# Patient Record
Sex: Female | Born: 1950 | Race: Black or African American | Hispanic: No | Marital: Single | Smoking: Never smoker
Health system: Southern US, Community
[De-identification: ages and names within clinical notes are randomized; demographics above are authoritative.]

## PROBLEM LIST (undated history)

## (undated) DIAGNOSIS — E7800 Pure hypercholesterolemia, unspecified: Secondary | ICD-10-CM

## (undated) DIAGNOSIS — M549 Dorsalgia, unspecified: Secondary | ICD-10-CM

## (undated) DIAGNOSIS — G8929 Other chronic pain: Secondary | ICD-10-CM

## (undated) DIAGNOSIS — I10 Essential (primary) hypertension: Secondary | ICD-10-CM

## (undated) HISTORY — DX: Pure hypercholesterolemia, unspecified: E78.00

## (undated) HISTORY — PX: APPENDECTOMY: SHX54

---

## 2008-03-29 ENCOUNTER — Emergency Department (HOSPITAL_COMMUNITY): Admission: EM | Admit: 2008-03-29 | Discharge: 2008-03-29 | Payer: Self-pay | Admitting: Emergency Medicine

## 2008-07-07 ENCOUNTER — Encounter: Admission: RE | Admit: 2008-07-07 | Discharge: 2008-07-07 | Payer: Self-pay | Admitting: Orthopedic Surgery

## 2008-10-15 ENCOUNTER — Encounter: Admission: RE | Admit: 2008-10-15 | Discharge: 2008-10-15 | Payer: Self-pay | Admitting: Orthopedic Surgery

## 2008-12-25 ENCOUNTER — Ambulatory Visit (HOSPITAL_COMMUNITY): Admission: RE | Admit: 2008-12-25 | Discharge: 2008-12-26 | Payer: Self-pay | Admitting: Orthopedic Surgery

## 2009-02-16 ENCOUNTER — Encounter: Admission: RE | Admit: 2009-02-16 | Discharge: 2009-05-17 | Payer: Self-pay | Admitting: Orthopedic Surgery

## 2009-05-25 ENCOUNTER — Encounter: Admission: RE | Admit: 2009-05-25 | Discharge: 2009-07-08 | Payer: Self-pay | Admitting: Orthopedic Surgery

## 2009-08-27 ENCOUNTER — Emergency Department (HOSPITAL_COMMUNITY): Admission: EM | Admit: 2009-08-27 | Discharge: 2009-08-27 | Payer: Self-pay | Admitting: Family Medicine

## 2009-10-12 ENCOUNTER — Encounter: Payer: Self-pay | Admitting: Internal Medicine

## 2009-10-12 ENCOUNTER — Ambulatory Visit: Payer: Self-pay | Admitting: Internal Medicine

## 2009-10-12 ENCOUNTER — Encounter (INDEPENDENT_AMBULATORY_CARE_PROVIDER_SITE_OTHER): Payer: Self-pay | Admitting: Family Medicine

## 2009-10-12 LAB — CONVERTED CEMR LAB
ALT: 12 units/L (ref 0–35)
AST: 15 units/L (ref 0–37)
Albumin: 4.3 g/dL (ref 3.5–5.2)
Alkaline Phosphatase: 55 units/L (ref 39–117)
BUN: 17 mg/dL (ref 6–23)
Basophils Absolute: 0 10*3/uL (ref 0.0–0.1)
Basophils Relative: 1 % (ref 0–1)
CO2: 30 meq/L (ref 19–32)
Calcium: 9.6 mg/dL (ref 8.4–10.5)
Chloride: 106 meq/L (ref 96–112)
Cholesterol: 196 mg/dL (ref 0–200)
Creatinine, Ser: 0.88 mg/dL (ref 0.40–1.20)
Eosinophils Absolute: 0.1 10*3/uL (ref 0.0–0.7)
Eosinophils Relative: 1 % (ref 0–5)
Glucose, Bld: 100 mg/dL — ABNORMAL HIGH (ref 70–99)
HCT: 43.5 % (ref 36.0–46.0)
HDL: 58 mg/dL (ref >39)
Hemoglobin: 13.9 g/dL (ref 12.0–15.0)
LDL Cholesterol: 122 mg/dL — ABNORMAL HIGH (ref 0–99)
Lymphocytes Relative: 27 % (ref 12–46)
Lymphs Abs: 1.6 10*3/uL (ref 0.7–4.0)
MCHC: 32 g/dL (ref 30.0–36.0)
MCV: 89.7 fL (ref 78.0–100.0)
Monocytes Absolute: 0.2 10*3/uL (ref 0.1–1.0)
Monocytes Relative: 4 % (ref 3–12)
Neutro Abs: 4 10*3/uL (ref 1.7–7.7)
Neutrophils Relative %: 68 % (ref 43–77)
Platelets: 223 10*3/uL (ref 150–400)
Potassium: 4.2 meq/L (ref 3.5–5.3)
RBC: 4.85 M/uL (ref 3.87–5.11)
RDW: 15.1 % (ref 11.5–15.5)
Sodium: 144 meq/L (ref 135–145)
TSH: 1.984 microintl units/mL (ref 0.350–4.500)
Total Bilirubin: 0.7 mg/dL (ref 0.3–1.2)
Total CHOL/HDL Ratio: 3.4
Total Protein: 6.6 g/dL (ref 6.0–8.3)
Triglycerides: 80 mg/dL (ref <150)
VLDL: 16 mg/dL (ref 0–40)
WBC: 5.9 10*3/uL (ref 4.0–10.5)

## 2009-10-13 ENCOUNTER — Encounter: Payer: Self-pay | Admitting: Internal Medicine

## 2009-10-20 ENCOUNTER — Ambulatory Visit: Payer: Self-pay | Admitting: Internal Medicine

## 2009-10-20 ENCOUNTER — Ambulatory Visit (HOSPITAL_COMMUNITY): Admission: RE | Admit: 2009-10-20 | Discharge: 2009-10-20 | Payer: Self-pay | Admitting: Internal Medicine

## 2009-10-25 ENCOUNTER — Emergency Department (HOSPITAL_COMMUNITY): Admission: EM | Admit: 2009-10-25 | Discharge: 2009-10-26 | Payer: Self-pay | Admitting: Emergency Medicine

## 2009-10-26 ENCOUNTER — Encounter: Payer: Self-pay | Admitting: Internal Medicine

## 2009-11-01 ENCOUNTER — Ambulatory Visit: Payer: Self-pay | Admitting: Internal Medicine

## 2009-11-03 DIAGNOSIS — I10 Essential (primary) hypertension: Secondary | ICD-10-CM | POA: Insufficient documentation

## 2009-11-03 DIAGNOSIS — E7800 Pure hypercholesterolemia, unspecified: Secondary | ICD-10-CM

## 2009-11-04 ENCOUNTER — Ambulatory Visit: Payer: Self-pay | Admitting: Internal Medicine

## 2009-11-04 DIAGNOSIS — E663 Overweight: Secondary | ICD-10-CM | POA: Insufficient documentation

## 2009-11-04 DIAGNOSIS — R079 Chest pain, unspecified: Secondary | ICD-10-CM

## 2009-11-26 ENCOUNTER — Encounter (INDEPENDENT_AMBULATORY_CARE_PROVIDER_SITE_OTHER): Payer: Self-pay | Admitting: *Deleted

## 2009-11-26 ENCOUNTER — Ambulatory Visit: Payer: Self-pay

## 2009-11-26 ENCOUNTER — Ambulatory Visit: Payer: Self-pay | Admitting: Internal Medicine

## 2009-11-26 ENCOUNTER — Ambulatory Visit (HOSPITAL_COMMUNITY): Admission: RE | Admit: 2009-11-26 | Discharge: 2009-11-26 | Payer: Self-pay | Admitting: Internal Medicine

## 2009-11-26 ENCOUNTER — Encounter: Payer: Self-pay | Admitting: Internal Medicine

## 2009-12-01 ENCOUNTER — Telehealth: Payer: Self-pay | Admitting: Internal Medicine

## 2009-12-03 ENCOUNTER — Encounter (INDEPENDENT_AMBULATORY_CARE_PROVIDER_SITE_OTHER): Payer: Self-pay | Admitting: *Deleted

## 2009-12-07 ENCOUNTER — Ambulatory Visit: Payer: Self-pay | Admitting: Internal Medicine

## 2009-12-13 ENCOUNTER — Telehealth: Payer: Self-pay | Admitting: Internal Medicine

## 2009-12-17 ENCOUNTER — Ambulatory Visit: Payer: Self-pay | Admitting: Internal Medicine

## 2009-12-17 DIAGNOSIS — I10 Essential (primary) hypertension: Secondary | ICD-10-CM

## 2009-12-17 LAB — CONVERTED CEMR LAB
ALT: 18 units/L (ref 0–35)
AST: 24 units/L (ref 0–37)
Cholesterol: 134 mg/dL (ref 0–200)
LDL Cholesterol: 72 mg/dL (ref 0–99)
Total Bilirubin: 1 mg/dL (ref 0.3–1.2)
Total CHOL/HDL Ratio: 2
Total Protein: 6.6 g/dL (ref 6.0–8.3)

## 2010-02-09 ENCOUNTER — Ambulatory Visit: Payer: Self-pay | Admitting: Internal Medicine

## 2010-02-10 ENCOUNTER — Ambulatory Visit (HOSPITAL_COMMUNITY): Admission: RE | Admit: 2010-02-10 | Discharge: 2010-02-10 | Payer: Self-pay | Admitting: Family Medicine

## 2010-02-28 ENCOUNTER — Encounter
Admission: RE | Admit: 2010-02-28 | Discharge: 2010-05-05 | Payer: Self-pay | Source: Home / Self Care | Admitting: Family Medicine

## 2010-03-21 ENCOUNTER — Telehealth: Payer: Self-pay | Admitting: Internal Medicine

## 2010-05-04 ENCOUNTER — Ambulatory Visit: Payer: Self-pay | Admitting: Internal Medicine

## 2010-05-04 ENCOUNTER — Encounter (INDEPENDENT_AMBULATORY_CARE_PROVIDER_SITE_OTHER): Payer: Self-pay | Admitting: Family Medicine

## 2010-05-04 LAB — CONVERTED CEMR LAB
BUN: 15 mg/dL (ref 6–23)
CO2: 32 meq/L (ref 19–32)
Chloride: 104 meq/L (ref 96–112)
HDL: 53 mg/dL (ref >39)
LDL Cholesterol: 87 mg/dL (ref 0–99)
Potassium: 4.6 meq/L (ref 3.5–5.3)
Triglycerides: 80 mg/dL (ref <150)
VLDL: 16 mg/dL (ref 0–40)

## 2010-07-04 ENCOUNTER — Ambulatory Visit (HOSPITAL_COMMUNITY): Admission: RE | Admit: 2010-07-04 | Discharge: 2010-07-04 | Payer: Self-pay | Admitting: Family Medicine

## 2010-07-24 ENCOUNTER — Emergency Department (HOSPITAL_COMMUNITY)
Admission: EM | Admit: 2010-07-24 | Discharge: 2010-07-24 | Payer: Self-pay | Source: Home / Self Care | Admitting: Emergency Medicine

## 2010-08-11 ENCOUNTER — Encounter
Admission: RE | Admit: 2010-08-11 | Discharge: 2010-08-11 | Payer: Self-pay | Source: Home / Self Care | Attending: Orthopedic Surgery | Admitting: Orthopedic Surgery

## 2010-08-13 ENCOUNTER — Encounter
Admission: RE | Admit: 2010-08-13 | Discharge: 2010-08-13 | Payer: Self-pay | Source: Home / Self Care | Attending: Orthopedic Surgery | Admitting: Orthopedic Surgery

## 2010-08-29 ENCOUNTER — Telehealth: Payer: Self-pay | Admitting: Internal Medicine

## 2010-09-13 ENCOUNTER — Other Ambulatory Visit (HOSPITAL_COMMUNITY)
Admission: RE | Admit: 2010-09-13 | Discharge: 2010-09-13 | Disposition: A | Discharge status: Home / Self Care | Payer: Medicaid Other | Source: Ambulatory Visit | Attending: Obstetrics and Gynecology | Admitting: Obstetrics and Gynecology

## 2010-09-13 ENCOUNTER — Other Ambulatory Visit: Payer: Self-pay | Admitting: Obstetrics and Gynecology

## 2010-09-13 DIAGNOSIS — Z01419 Encounter for gynecological examination (general) (routine) without abnormal findings: Secondary | ICD-10-CM | POA: Insufficient documentation

## 2010-09-20 ENCOUNTER — Other Ambulatory Visit: Payer: Self-pay | Admitting: Obstetrics and Gynecology

## 2010-09-20 DIAGNOSIS — Z1239 Encounter for other screening for malignant neoplasm of breast: Secondary | ICD-10-CM

## 2010-09-20 NOTE — Progress Notes (Signed)
Summary: Questions about Liptor  Phone Note Call from Patient Call back at [facility] Phone [phone]   Caller: Patient Summary of Call: Pt have question regarding Liptor Initial call taken by: Judie Grieve,  March 21, 2010 4:01 PM  Follow-up for Phone Call        [facility] for call back. Follow-up by: Suzan Garibaldi RN

## 2010-09-20 NOTE — Consult Note (Signed)
Summary: [facility] Referral Form  [facility] Referral Form   Imported By: Roderic Ovens 11/24/2009 16:46:01  _____________________________________________________________________  External Attachment:    Type:   Image     Comment:   External Document

## 2010-09-20 NOTE — Miscellaneous (Signed)
Summary: Outpatient Coinsurance Notice  Outpatient Coinsurance Notice   Imported By: Marylou Mccoy 11/26/2009 18:09:14  _____________________________________________________________________  External Attachment:    Type:   Image     Comment:   External Document

## 2010-09-20 NOTE — Letter (Signed)
Summary: Generic Letter  Architectural technologist, Main Office  [address]   [place], [place] [address]   Phone: [phone]  Fax: [phone]    12/03/2009  [facility] [address] [place], [place]  [address]  Dear Ms. Jowett,  We have been attempting to call you by phone to discuss the results of the stress echocardiogram test that you had here in our office but the phone number that we have is not in working order. Please call back so we can discuss recommendations from Dr.Ross.         Sincerely,   Layne Benton, RN, BSN

## 2010-09-20 NOTE — Progress Notes (Signed)
Summary: pt wants results of myoview  Phone Note Call from Patient Call back at [phone]   Caller: Patient Reason for Call: Talk to Nurse, Lab or Test Results Summary of Call: pt needs results of myoview Initial call taken by: Omer Jack,  December 13, 2009 12:36 PM  Follow-up for Phone Call        talked with patient about results of stress echo done 11-26-09--lipid/liver profile scheduled for 12-16-09

## 2010-09-20 NOTE — Assessment & Plan Note (Signed)
Summary: np6/htn/HCD/chest pain with activity & stress   Visit Type:  Initial Consult Primary Provider:  [facility]  CC:  pt has vertigo. She has also had chest pain and increased sob.  History of Present Illness: Patient is a 60 year old with a hisotry of hyperteinsion, dyslipidiemia.  She is referred for evaluation of chest pain. the patient says during exercise a few wks ago she bacan to notice chest pressure.   Episodes would stop with rest.  She has had more sinus problems with SOB/congestion.  No cough.  Has  "lump in her throat" from drainage.  With spells her heart has been pounding. Today she says she has some chest pressure while sitting in clinic.  Also complains of L shoulder pain (had shoulder surgery) and sinus drainage.  Current Medications (verified): 1)  Diovan Hct 320-25 Mg Tabs (Valsartan-Hydrochlorothiazide) .Marland Kitchen.. 1 Tab Once Daily 2)  Phentermine Hcl 37.5 Mg Caps (Phentermine Hcl) .Marland Kitchen.. 1 Tab Once Daily 3)  Lipitor 10 Mg Tabs (Atorvastatin Calcium) .... Take One Tablet By Mouth Daily. 4)  Aspirin 81 Mg Tbec (Aspirin) .... Take One Tablet By Mouth Daily 5)  Vitamin C 500 Mg  Tabs (Ascorbic Acid) .... 2 Tab Once Daily 6)  Vit C Energy Mix .... As Needed 7)  Vitamin E 600 Unit  Caps (Vitamin E) .Marland Kitchen.. 1 Tab Once Daily 8)  Vit D-3 .Marland Kitchen.. 1 Tab Qd 9)  Vit Zinc .... 50 Mg One Tab Once Daily 10)  Prenatal Vit .Marland Kitchen.. 1 Tab Once Daily 11)  Fish Oil 1200mg  .... 1 Tab Qd 12)  Flax Seed .... Pt Adds To Her Food On Occ 13)  Oxycodone-Acetaminophen 5-325 Mg Tabs (Oxycodone-Acetaminophen) .Marland Kitchen.. 1 or 2 Q 4 To 6 Hours As Needed 14)  Capital/codeine 120-12 Mg/19ml Susp (Acetaminophen-Codeine) .... As Needed  1 Teaspoon 15)  Zithromax 250 Mg Tabs (Azithromycin) .... Pt Taking Z Pack At This Time 16)  Percocet 5-325 Mg Tabs (Oxycodone-Acetaminophen) .Marland Kitchen.. 1 - 2    (4-6 Hours As Needed) 17)  Vemifen .... Estrogen  Allergies: 1)  ! Pcn 2)  ! * Amoxicillan  Past History:  Past Medical  History: Last updated: 11/03/2009 Current Problems:  HYPERCHOLESTEROLEMIA (ICD-272.0) HYPERTENSION (ICD-401.9)  Past Surgical History: Last updated: 11/03/2009 rotator cuff repair  Family History: Last updated: 11-23-09 Mother died of pancreatic ca 73 Father died from bad whiskey  Social History: Last updated: 11/23/2009 pt lives alone No tobacco No EtoH  Family History: Mother died of pancreatic ca 27 Father died from bad whiskey  Social History: pt lives alone No tobacco No EtoH  Review of Systems       All systems reviewed.  Negative to the above problem except as noted.  Vital Signs:  Patient profile:   60 year old female Height:      65 inches Weight:      151 pounds BMI:     25.22 Pulse (ortho):   95 / minute BP sitting:   106 / 73  (left arm) BP standing:   98 / 72 Cuff size:   regular  Vitals Entered By: Burnett Kanaris, CNA (11-23-2009 12:33 PM)  Serial Vital Signs/Assessments:  Time      Position  BP       Pulse  Resp  Temp     By      Lying LA  105/74   83  Burnett Kanaris, CNA      Sitting   106/73   [address]                    Burnett Kanaris, CNA      Standing  98/72    95                    Burnett Kanaris, CNA 3 min     Standing  106/75   94                    Burnett Kanaris, CNA 2 min     Standing  102/73   94                    Burnett Kanaris, CNA  Comments: pt had dizziness when she went from lying to sitting up- When pt stood up dizziness started going away but she feels her heart beating faster By: Burnett Kanaris, CNA  2 min  no dizziness By: Burnett Kanaris, CNA    Physical Exam  Additional Exam:  Patient is in NAD HEENT:  Normocephalic, atraumatic. EOMI, PERRLA.  Neck: JVP is normal. No thyromegaly. No bruits.  Lungs: clear to auscultation. No rales no wheezes.  Chest  tender but does not reproduce discomfort. Heart: Regular rate and rhythm. Normal S1, S2. No S3.   No significant murmurs. PMI not  displaced.  Abdomen:  Supple, nontender. Normal bowel sounds. No masses. No hepatomegaly.  Extremities:   Good distal pulses throughout. No lower extremity edema.  Musculoskeletal :moving all extremities.  Neuro:   alert and oriented x3.    EKG  Procedure date:  11/04/2009  Findings:      NSR.  90 bpm.  Impression & Recommendations:  Problem # 1:  CHEST PAIN UNSPECIFIED (ICD-786.50) I am not convinced the patient's chest pain is cardia.  Having some now.  With risk factors would schedule stress echo to eval.  Problem # 2:  HYPERCHOLESTEROLEMIA (ICD-272.0) Will need to get [facility]. Keep on lipitor.  Problem # 3:  HYPERTENSION (ICD-401.9) Adeaquate control.  Problem # 4:  OVERWEIGHT (ICD-278.02) Counselled against phentermine.  Rec dietary referral, will defer to [facility].  Other Orders: Stress Echo (Stress Echo)  Patient Instructions: 1)  Your physician has requested that you have a stress echocardiogram. For further information please visit [email or web address]  Please do not eat or drink for 3 hours prior to test, wear walking shoes. 2)  Your physician has recommended you make the following change in your medication: increase Lipitor to 20mg  every day

## 2010-09-20 NOTE — Progress Notes (Signed)
Summary: out of lipitor/refill meds  Phone Note Refill Request Call back at Home Phone [phone] Call back at [phone] Message from:  Patient on December 01, 2009 10:11 AM  Refills Requested: Medication #1:  LIPITOR 10 MG TABS Take two  tablets by mouth daily[facility] [phone]   Method Requested: Fax to Local Pharmacy Initial call taken by: Lorne Skeens,  December 01, 2009 10:12 AM Caller: Patient Reason for Call: Talk to Nurse Summary of Call: seen on 4/8 by dr. Tenny Craw - wanted pt to take 2 of lipitor pt is out.   Follow-up for Phone Call        I have called the pt and left her a message that he lipitor has been sent to her pharmacy for the increased dose (20mg  tab). Follow-up by: Sherri Rad, RN, BSN,  December 01, 2009 10:25 AM    New/Updated Medications: LIPITOR 20 MG TABS (ATORVASTATIN CALCIUM) Take one tablet by mouth daily. Prescriptions: LIPITOR 20 MG TABS (ATORVASTATIN CALCIUM) Take one tablet by mouth daily.  #30 x 6   Entered by:   Sherri Rad, RN, BSN   Authorized by:   Sherrill Raring, MD, [facility]   Signed by:   Sherri Rad, RN, BSN on 12/01/2009   Method used:   Faxed to ...       [facility] (retail)       [address] [place], [place]  [address]       Ph: [phone]       Fax: [phone]   RxID:   [id number]

## 2010-09-22 NOTE — Progress Notes (Signed)
Summary: pt has question re bloodwork  Phone Note Call from Patient   Caller: Patient [phone] Reason for Call: Talk to Nurse Summary of Call: pt calling to see if she is due for blood work Initial call taken by: Glynda Jaeger,  August 29, 2010 2:35 PM  Follow-up for Phone Call        Called patient and advised her that she is due for lipid panel in April 2012. She needs a refill for Lipitor...will send to [facility].  Layne Benton, RN, BSN  August 29, 2010 2:52 PM     Prescriptions: LIPITOR 20 MG TABS (ATORVASTATIN CALCIUM) Take one tablet by mouth daily.  #30 x 6   Entered by:   Layne Benton, RN, BSN   Authorized by:   Sherrill Raring, MD, [facility]   Signed by:   Layne Benton, RN, BSN on 08/29/2010   Method used:   Electronically to        [facility]* (retail)       [address] [place], [place]  [address]       Ph: [phone]       Fax: [phone]   RxID:   [id number]

## 2010-10-14 ENCOUNTER — Ambulatory Visit: Payer: Self-pay | Admitting: Internal Medicine

## 2010-10-24 ENCOUNTER — Ambulatory Visit: Payer: Self-pay

## 2010-10-27 ENCOUNTER — Ambulatory Visit
Admission: RE | Admit: 2010-10-27 | Discharge: 2010-10-27 | Disposition: A | Discharge status: Home / Self Care | Payer: Medicare Other | Source: Ambulatory Visit | Attending: Obstetrics and Gynecology | Admitting: Obstetrics and Gynecology

## 2010-10-27 DIAGNOSIS — Z1239 Encounter for other screening for malignant neoplasm of breast: Secondary | ICD-10-CM

## 2010-11-14 LAB — RAPID URINE DRUG SCREEN, HOSP PERFORMED
Amphetamines: NOT DETECTED
Barbiturates: NOT DETECTED
Benzodiazepines: NOT DETECTED
Cocaine: NOT DETECTED
Opiates: POSITIVE — AB

## 2010-11-14 LAB — URINALYSIS, ROUTINE W REFLEX MICROSCOPIC
Bilirubin Urine: NEGATIVE
Hgb urine dipstick: NEGATIVE
Protein, ur: NEGATIVE mg/dL
Urobilinogen, UA: 0.2 mg/dL (ref 0.0–1.0)

## 2010-11-14 LAB — DIFFERENTIAL
Basophils Absolute: 0.1 10*3/uL (ref 0.0–0.1)
Eosinophils Absolute: 0.1 10*3/uL (ref 0.0–0.7)
Eosinophils Relative: 2 % (ref 0–5)
Lymphocytes Relative: 38 % (ref 12–46)
Neutrophils Relative %: 50 % (ref 43–77)

## 2010-11-14 LAB — CBC
MCHC: 32.6 g/dL (ref 30.0–36.0)
MCV: 90.3 fL (ref 78.0–100.0)
RBC: 4.29 MIL/uL (ref 3.87–5.11)

## 2010-11-14 LAB — BASIC METABOLIC PANEL
BUN: 21 mg/dL (ref 6–23)
CO2: 25 mEq/L (ref 19–32)
Chloride: 106 mEq/L (ref 96–112)
Creatinine, Ser: 0.85 mg/dL (ref 0.4–1.2)
GFR calc Af Amer: >60 mL/min (ref >60)

## 2010-11-14 LAB — SALICYLATE LEVEL: Salicylate Lvl: <4 mg/dL (ref 2.8–20.0)

## 2010-11-29 LAB — BASIC METABOLIC PANEL
BUN: 14 mg/dL (ref 6–23)
Creatinine, Ser: 0.91 mg/dL (ref 0.4–1.2)
GFR calc Af Amer: >60 mL/min (ref >60)
GFR calc non Af Amer: >60 mL/min (ref >60)
Potassium: 4.4 mEq/L (ref 3.5–5.1)

## 2010-11-29 LAB — CBC
Platelets: 215 10*3/uL (ref 150–400)
RBC: 4.46 MIL/uL (ref 3.87–5.11)
WBC: 6.6 10*3/uL (ref 4.0–10.5)

## 2011-01-03 NOTE — Op Note (Signed)
NAME:  Tiffany Shepherd, Tiffany Shepherd NO.:  [id number]   MEDICAL RECORD NO.:  [id number]          PATIENT TYPE:  REC   LOCATION:  [facility]                          FACILITY:  [facility]   PHYSICIAN:  Dyke Brackett, M.D.    DATE OF BIRTH:  1951-02-16   DATE OF PROCEDURE:  12/25/2008  DATE OF DISCHARGE:                               OPERATIVE REPORT   INDICATIONS:  The patient is a 60 year old status post rotator cuff  repair of left persistent pain, not responding to conservative  treatment, thought to be amenable to outpatient surgery with possible  overnight.   PREOPERATIVE DIAGNOSIS:  Recurrent rotator cuff tear.   POSTOPERATIVE DIAGNOSES:  1. Glenohumeral arthritis.  2. Mild arthrofibrosis.   OPERATIONS:  1. Arthroscopic glenohumeral debridement.  2. Arthroscopic lysis of adhesions.   SURGEON:  Dyke Brackett, MD   ASSISTANT:  Joslyn Devon. Smith, PA   ANESTHESIA:  General.   ESTIMATED BLOOD LOSS:  Minimal.   DESCRIPTION OF PROCEDURE:  She was examined under anesthesia and had  very good range of motion.  Arthroscopic findings intra-articularly  included an intact rotator cuff repair.  Tissues attenuated somewhat  compared to a normal cup but the repair itself was intact.  The joint  itself showed some mild progression of the degenerative change in the  shoulder, estimated generalized grade 3 changes of the glenoid, and  grade 2 changes of the humeral head.  These were debrided.  Some mild  adhesions in the shoulder both intra-articularly as well as subacromial  which were debrided.  Superior surface of the cuff showed good a cuff  tissue with no evidence of full-thickness tear and again no thrombus in  the [facility] joint or the acromion was noted.  She had had previous  acromioplasty, oblique distal clavicle excision, and debridement was  carried out as well as lysis of adhesions.  Then a scar on her shoulder  which was mildly hypertrophic was excised and closed with interrupted  Vicryl with running Prolene.  She was taken to the recovery room in  stable condition.      Dyke Brackett, M.D.  Electronically Signed    WDC/MEDQ  D:  12/25/2008  T:  12/26/2008  Job:  [id number]

## 2011-03-01 ENCOUNTER — Ambulatory Visit: Payer: Medicare Other | Attending: Physical Medicine and Rehabilitation

## 2011-03-01 DIAGNOSIS — M545 Low back pain, unspecified: Secondary | ICD-10-CM | POA: Insufficient documentation

## 2011-03-01 DIAGNOSIS — IMO0001 Reserved for inherently not codable concepts without codable children: Secondary | ICD-10-CM | POA: Insufficient documentation

## 2011-03-01 DIAGNOSIS — R5381 Other malaise: Secondary | ICD-10-CM | POA: Insufficient documentation

## 2011-03-02 ENCOUNTER — Ambulatory Visit: Payer: Medicare Other | Admitting: Physical Therapy

## 2011-03-07 ENCOUNTER — Ambulatory Visit: Payer: Medicare Other

## 2011-03-09 ENCOUNTER — Ambulatory Visit: Payer: Medicare Other | Admitting: Physical Therapy

## 2011-03-16 ENCOUNTER — Ambulatory Visit: Payer: Medicare Other

## 2011-03-28 ENCOUNTER — Ambulatory Visit: Payer: Medicare Other | Attending: Physical Medicine and Rehabilitation | Admitting: Physical Therapy

## 2011-03-28 DIAGNOSIS — IMO0001 Reserved for inherently not codable concepts without codable children: Secondary | ICD-10-CM | POA: Insufficient documentation

## 2011-03-28 DIAGNOSIS — M545 Low back pain, unspecified: Secondary | ICD-10-CM | POA: Insufficient documentation

## 2011-03-28 DIAGNOSIS — R5381 Other malaise: Secondary | ICD-10-CM | POA: Insufficient documentation

## 2011-03-30 ENCOUNTER — Ambulatory Visit: Payer: Medicare Other | Admitting: Physical Therapy

## 2011-08-28 DIAGNOSIS — M48061 Spinal stenosis, lumbar region without neurogenic claudication: Secondary | ICD-10-CM | POA: Diagnosis not present

## 2011-08-28 DIAGNOSIS — G894 Chronic pain syndrome: Secondary | ICD-10-CM | POA: Diagnosis not present

## 2011-09-07 DIAGNOSIS — Z1211 Encounter for screening for malignant neoplasm of colon: Secondary | ICD-10-CM | POA: Diagnosis not present

## 2011-09-09 ENCOUNTER — Emergency Department (HOSPITAL_COMMUNITY)
Admission: EM | Admit: 2011-09-09 | Discharge: 2011-09-10 | Disposition: A | Discharge status: Home / Self Care | Payer: Medicare Other | Attending: Emergency Medicine | Admitting: Emergency Medicine

## 2011-09-09 ENCOUNTER — Encounter (HOSPITAL_COMMUNITY): Payer: Self-pay | Admitting: *Deleted

## 2011-09-09 DIAGNOSIS — K529 Noninfective gastroenteritis and colitis, unspecified: Secondary | ICD-10-CM

## 2011-09-09 DIAGNOSIS — R110 Nausea: Secondary | ICD-10-CM | POA: Diagnosis not present

## 2011-09-09 DIAGNOSIS — Z79899 Other long term (current) drug therapy: Secondary | ICD-10-CM | POA: Diagnosis not present

## 2011-09-09 DIAGNOSIS — K5289 Other specified noninfective gastroenteritis and colitis: Secondary | ICD-10-CM | POA: Insufficient documentation

## 2011-09-09 DIAGNOSIS — I10 Essential (primary) hypertension: Secondary | ICD-10-CM | POA: Diagnosis not present

## 2011-09-09 DIAGNOSIS — R1031 Right lower quadrant pain: Secondary | ICD-10-CM | POA: Diagnosis not present

## 2011-09-09 DIAGNOSIS — N289 Disorder of kidney and ureter, unspecified: Secondary | ICD-10-CM | POA: Diagnosis not present

## 2011-09-09 DIAGNOSIS — R109 Unspecified abdominal pain: Secondary | ICD-10-CM | POA: Insufficient documentation

## 2011-09-09 HISTORY — DX: Essential (primary) hypertension: I10

## 2011-09-09 LAB — COMPREHENSIVE METABOLIC PANEL
ALT: 15 U/L (ref 0–35)
Albumin: 3.7 g/dL (ref 3.5–5.2)
Alkaline Phosphatase: 72 U/L (ref 39–117)
Calcium: 9.5 mg/dL (ref 8.4–10.5)
GFR calc Af Amer: >90 mL/min (ref >90)
Potassium: 3.3 mEq/L — ABNORMAL LOW (ref 3.5–5.1)
Sodium: 143 mEq/L (ref 135–145)
Total Protein: 7 g/dL (ref 6.0–8.3)

## 2011-09-09 LAB — URINALYSIS, ROUTINE W REFLEX MICROSCOPIC
Bilirubin Urine: NEGATIVE
Hgb urine dipstick: NEGATIVE
Nitrite: NEGATIVE
Specific Gravity, Urine: 1.015 (ref 1.005–1.030)
Urobilinogen, UA: 0.2 mg/dL (ref 0.0–1.0)
pH: 7 (ref 5.0–8.0)

## 2011-09-09 LAB — CBC
HCT: 36.6 % (ref 36.0–46.0)
Hemoglobin: 12.3 g/dL (ref 12.0–15.0)
WBC: 6.9 10*3/uL (ref 4.0–10.5)

## 2011-09-09 LAB — DIFFERENTIAL
Basophils Absolute: 0 10*3/uL (ref 0.0–0.1)
Basophils Relative: 0 % (ref 0–1)
Lymphocytes Relative: 38 % (ref 12–46)
Lymphs Abs: 2.6 10*3/uL (ref 0.7–4.0)
Monocytes Absolute: 0.6 10*3/uL (ref 0.1–1.0)
Monocytes Relative: 8 % (ref 3–12)
Neutro Abs: 3.5 10*3/uL (ref 1.7–7.7)

## 2011-09-09 LAB — LACTIC ACID, PLASMA: Lactic Acid, Venous: 0.9 mmol/L (ref 0.5–2.2)

## 2011-09-09 LAB — LIPASE, BLOOD: Lipase: 49 U/L (ref 11–59)

## 2011-09-09 MED ORDER — IOHEXOL 300 MG/ML  SOLN: 20.0000 mL | Freq: Once | INTRAMUSCULAR | Status: AC | PRN

## 2011-09-09 MED ORDER — SODIUM CHLORIDE 0.9 % IV BOLUS (SEPSIS)
1000.0000 mL | Freq: Once | INTRAVENOUS | Status: AC
  Administered 2011-09-09: 1000 mL via INTRAVENOUS

## 2011-09-09 MED ORDER — HYDROMORPHONE HCL PF 1 MG/ML IJ SOLN
1.0000 mg | Freq: Once | INTRAMUSCULAR | Status: AC
  Administered 2011-09-09: 1 mg via INTRAVENOUS
  Filled 2011-09-09: qty 1

## 2011-09-09 MED ORDER — POTASSIUM CHLORIDE CRYS ER 20 MEQ PO TBCR
20.0000 meq | EXTENDED_RELEASE_TABLET | Freq: Once | ORAL | Status: AC
  Administered 2011-09-09: 20 meq via ORAL
  Filled 2011-09-09: qty 1

## 2011-09-09 MED ORDER — ONDANSETRON HCL 4 MG/2ML IJ SOLN
4.0000 mg | Freq: Once | INTRAMUSCULAR | Status: AC
  Administered 2011-09-09: 4 mg via INTRAVENOUS
  Filled 2011-09-09: qty 2

## 2011-09-09 NOTE — ED Provider Notes (Addendum)
History     CSN: [id number]  Arrival date & time 09/09/11  4098   First MD Initiated Contact with Patient 09/09/11 2133      Chief Complaint  Patient presents with  . Abdominal Pain    (Consider location/radiation/quality/duration/timing/severity/associated sxs/prior treatment) Patient is a 61 y.o. female presenting with abdominal pain. The history is provided by the patient. No language interpreter was used.  Abdominal Pain The primary symptoms of the illness include abdominal pain. The primary symptoms of the illness do not include fever, fatigue, shortness of breath, nausea, vomiting, diarrhea or dysuria. The current episode started 2 days ago. The onset of the illness was gradual. The problem has been gradually worsening.  The pain came on gradually. The abdominal pain has been gradually worsening since its onset. The abdominal pain is located in the RLQ and suprapubic region. The abdominal pain does not radiate. The abdominal pain is relieved by nothing. The abdominal pain is exacerbated by movement.  Associated with: had recent colonoscopy - instructed to seek eval by gi physician. The patient states that she believes she is currently not pregnant. The patient has not had a change in bowel habit. Risk factors for an acute abdominal problem include being elderly. Symptoms associated with the illness do not include chills, anorexia, constipation, urgency or frequency.    Past Medical History  Diagnosis Date  . Hypertension     History reviewed. No pertinent past surgical history.  History reviewed. No pertinent family history.  History  Substance Use Topics  . Smoking status: Never Smoker   . Smokeless tobacco: Not on file  . Alcohol Use: No    OB History    Grav Para Term Preterm Abortions TAB SAB Ect Mult Living                  Review of Systems  Constitutional: Negative for fever, chills, activity change, appetite change and fatigue.  HENT: Negative for  congestion, sore throat, rhinorrhea, neck pain and neck stiffness.   Respiratory: Negative for cough, chest tightness and shortness of breath.   Cardiovascular: Negative for chest pain and palpitations.  Gastrointestinal: Positive for abdominal pain. Negative for nausea, vomiting, diarrhea, constipation and anorexia.  Genitourinary: Negative for dysuria, urgency, frequency and flank pain.  Neurological: Negative for dizziness, light-headedness, numbness and headaches.  All other systems reviewed and are negative.    Allergies  Penicillins  Home Medications   Current Outpatient Rx  Name Route Sig Dispense Refill  . ATORVASTATIN CALCIUM 20 MG PO TABS Oral Take 20 mg by mouth every evening.    . CHOLECALCIFEROL 400 UNITS PO TABS Oral Take 400 Units by mouth daily.    Marland Kitchen CO-ENZYME Q-10 50 MG PO CAPS Oral Take 50 mg by mouth daily.    Marland Kitchen ESTRADIOL 25 MCG VA TABS Vaginal Place 25 mcg vaginally 2 (two) times a week.    Marland Kitchen LEVOCETIRIZINE DIHYDROCHLORIDE 5 MG PO TABS Oral Take 5 mg by mouth every evening.    Marland Kitchen MECLIZINE HCL 25 MG PO TABS Oral Take 25 mg by mouth 3 (three) times daily as needed. For vertigo    . MELOXICAM 15 MG PO TABS Oral Take 15 mg by mouth daily.    Marland Kitchen HAIR VITAMINS PO Oral Take 1 tablet by mouth daily.    Marland Kitchen PRENATAL MULTIVITAMIN CH Oral Take 1 tablet by mouth daily.    . TRAMADOL HCL 50 MG PO TABS Oral Take 50-100 mg by mouth every 4 (four)  hours as needed. For pain    . VALSARTAN 320 MG PO TABS Oral Take 320 mg by mouth daily.    Marland Kitchen VITAMIN C 500 MG PO TABS Oral Take 500 mg by mouth daily.      BP 139/90  Pulse 65  Temp(Src) 98.3 F (36.8 C) (Oral)  Resp 16  SpO2 100%  Physical Exam  Nursing note and vitals reviewed. Constitutional: She is oriented to person, place, and time. She appears well-developed and well-nourished.  HENT:  Head: Normocephalic and atraumatic.  Mouth/Throat: Oropharynx is clear and moist.  Eyes: Conjunctivae and EOM are normal. Pupils are  equal, round, and reactive to light.  Neck: Normal range of motion. Neck supple.  Cardiovascular: Normal rate, regular rhythm, normal heart sounds and intact distal pulses.  Exam reveals no gallop and no friction rub.   No murmur heard. Pulmonary/Chest: Effort normal and breath sounds normal. No respiratory distress. She exhibits no tenderness.  Abdominal: Soft. Bowel sounds are normal. There is tenderness (rlq tenderness). There is guarding. There is no rebound.  Musculoskeletal: Normal range of motion. She exhibits no tenderness.  Neurological: She is alert and oriented to person, place, and time. No cranial nerve deficit.  Skin: Skin is warm and dry. No rash noted.    ED Course  Procedures (including critical care time)  Labs Reviewed  COMPREHENSIVE METABOLIC PANEL - Abnormal; Notable for the following:    Potassium 3.3 (*)    Glucose, Bld 102 (*)    All other components within normal limits  CBC  DIFFERENTIAL  LIPASE, BLOOD  URINALYSIS, ROUTINE W REFLEX MICROSCOPIC  LACTIC ACID, PLASMA   No results found.   1. Abdominal pain       MDM  Abdominal pain after a colonoscopy performed 2 days ago. Laboratory studies are unremarkable except for mild hypokalemia. I administered potassium. White blood cell count is normal. ECT abdomen pelvis is pending. The patient was discussed with my colleague dr Theodoro Kalata will provide the appropriate disposition for this patient. He is aware of the plan of care I have established. I administered pain medication one emergency department as well as IV fluids and anti-emesis   Dayton Bailiff, MD 09/10/11 0023   Results for orders placed during the hospital encounter of 09/09/11  CBC      Component Value Range   WBC 6.9  4.0 - 10.5 (K/uL)   RBC 4.22  3.87 - 5.11 (MIL/uL)   Hemoglobin 12.3  12.0 - 15.0 (g/dL)   HCT 16.1  09.6 - 04.5 (%)   MCV 86.7  78.0 - 100.0 (fL)   MCH 29.1  26.0 - 34.0 (pg)   MCHC 33.6  30.0 - 36.0 (g/dL)   RDW 40.9  81.1 -  91.4 (%)   Platelets 230  150 - 400 (K/uL)  DIFFERENTIAL      Component Value Range   Neutrophils Relative 51  43 - 77 (%)   Neutro Abs 3.5  1.7 - 7.7 (K/uL)   Lymphocytes Relative 38  12 - 46 (%)   Lymphs Abs 2.6  0.7 - 4.0 (K/uL)   Monocytes Relative 8  3 - 12 (%)   Monocytes Absolute 0.6  0.1 - 1.0 (K/uL)   Eosinophils Relative 2  0 - 5 (%)   Eosinophils Absolute 0.2  0.0 - 0.7 (K/uL)   Basophils Relative 0  0 - 1 (%)   Basophils Absolute 0.0  0.0 - 0.1 (K/uL)  COMPREHENSIVE METABOLIC PANEL  Component Value Range   Sodium 143  135 - 145 (mEq/L)   Potassium 3.3 (*) 3.5 - 5.1 (mEq/L)   Chloride 105  96 - 112 (mEq/L)   CO2 27  19 - 32 (mEq/L)   Glucose, Bld 102 (*) 70 - 99 (mg/dL)   BUN 18  6 - 23 (mg/dL)   Creatinine, Ser 4.78  0.50 - 1.10 (mg/dL)   Calcium 9.5  8.4 - 29.5 (mg/dL)   Total Protein 7.0  6.0 - 8.3 (g/dL)   Albumin 3.7  3.5 - 5.2 (g/dL)   AST 22  0 - 37 (U/L)   ALT 15  0 - 35 (U/L)   Alkaline Phosphatase 72  39 - 117 (U/L)   Total Bilirubin 0.6  0.3 - 1.2 (mg/dL)   GFR calc non Af Amer >90  >90 (mL/min)   GFR calc Af Amer >90  >90 (mL/min)  LIPASE, BLOOD      Component Value Range   Lipase 49  11 - 59 (U/L)  URINALYSIS, ROUTINE W REFLEX MICROSCOPIC      Component Value Range   Color, Urine YELLOW  YELLOW    APPearance CLEAR  CLEAR    Specific Gravity, Urine 1.015  1.005 - 1.030    pH 7.0  5.0 - 8.0    Glucose, UA NEGATIVE  NEGATIVE (mg/dL)   Hgb urine dipstick NEGATIVE  NEGATIVE    Bilirubin Urine NEGATIVE  NEGATIVE    Ketones, ur NEGATIVE  NEGATIVE (mg/dL)   Protein, ur NEGATIVE  NEGATIVE (mg/dL)   Urobilinogen, UA 0.2  0.0 - 1.0 (mg/dL)   Nitrite NEGATIVE  NEGATIVE    Leukocytes, UA NEGATIVE  NEGATIVE   LACTIC ACID, PLASMA      Component Value Range   Lactic Acid, Venous 0.9  0.5 - 2.2 (mmol/L)   Ct Abdomen Pelvis W Contrast  09/10/2011  *RADIOLOGY REPORT*  Clinical Data: Right lower quadrant pain.  Status post colonoscopy on the 17th.   Nausea.  CT ABDOMEN AND PELVIS WITH CONTRAST  Technique:  Multidetector CT imaging of the abdomen and pelvis was performed following the standard protocol during bolus administration of intravenous contrast.  Contrast: OMNIPAQUE IOHEXOL 300 MG/ML IV SOLN  Comparison: None.  Findings: The lung bases are clear.  Heart size is normal.  There is slight prominence/hyperemia of the vessels adjacent to the cecum and proximal ascending colon, with possible slight fat stranding.  Small 3 mm pericolonic lymph node is seen adjacent to the proximal ascending colon on image number 41.  Moderate amount of stool throughout the colon.  No definite colonic wall thickening.  Terminal ileum and remainder of the small bowel loops are normal in caliber and show no evidence of wall thickening or acute inflammatory change.  There is no free air.  The liver, spleen, gallbladder, pancreas, adrenal glands, and left kidney within normal limits.  3 mm low density lesion lower pole right kidney and 6 mm low density lesion in the upper pole right kidney statistically likely cysts.  There is no hydronephrosis. The abdominal aorta contains scattered atherosclerotic calcification and is normal in caliber.  The urinary bladder, uterus, and adnexa are within normal limits.  A discrete appendix is not visualized.  No retroperitoneal or mesenteric lymphadenopathy.  Very small amount of free fluid in the dependent portion of the pelvis to the right of midline.  Grade 1 anterolisthesis of L5 on S1, likely related to facet joint degenerative change of the lower lumbar spine.  No acute or suspicious bony abnormality.  IMPRESSION:  1. Slight prominence of the vasculature to the cecum and proximal ascending colon with a small 3 mm pericolonic lymph node and possible slight stranding in this region.  Findings raise the possibility of mild colitis or inflammatory bowel disease.  Suggest correlation with findings at recent colonoscopy. 2.  Moderate amount  of stool throughout the colon.  Question if the patient has clinical history of constipation. 3.  Too small to characterize right renal lesions.  These are statistically most likely benign. 4.  Small amount of free fluid in the pelvis.  Original Report Authenticated By: Britta Mccreedy, M.D.    Patient evaluated and CT scan results shared with patient as above. Symptoms improved. No nausea vomiting. No fevers. Plan close GI followup and medications provided. I reviewed patient's colonoscopy report that she carries with her. Colonoscopy performed by Dr. Laural Benes who was unable to evaluate her ascending colon. She has No peritonitis on exam with mild tender right lower quadrant. Prior appendectomy. By mouth Cipro and Flagyl provided. Patient also has paperwork recommending barium study to evaluate the ascending colon. She follows up with Dr. Sharyn Lull primary care. She agrees to contact him on Monday morning to order this study for follow. She agrees to strict return precautions for any fevers, vomiting worsening pain despite medications.  Sunnie Nielsen, MD 09/10/11 [phone]

## 2011-09-09 NOTE — ED Notes (Signed)
Patient had colonoscopy on Thursday and today she started having pain.  Called her MD and was told to come to ED for further evaluation.  Pain is on the right ride and feels like pressure

## 2011-09-10 ENCOUNTER — Encounter (HOSPITAL_COMMUNITY): Payer: Self-pay | Admitting: Radiology

## 2011-09-10 ENCOUNTER — Emergency Department (HOSPITAL_COMMUNITY): Payer: Medicare Other

## 2011-09-10 MED ORDER — OXYCODONE-ACETAMINOPHEN 5-325 MG PO TABS: 2.0000 | ORAL_TABLET | ORAL | Status: AC | PRN

## 2011-09-10 MED ORDER — IOHEXOL 300 MG/ML  SOLN
100.0000 mL | Freq: Once | INTRAMUSCULAR | Status: AC | PRN
  Administered 2011-09-10: 100 mL via INTRAVENOUS

## 2011-09-10 MED ORDER — PROMETHAZINE HCL 25 MG PO TABS: 25.0000 mg | ORAL_TABLET | Freq: Four times a day (QID) | ORAL | Status: AC | PRN

## 2011-09-10 MED ORDER — ONDANSETRON HCL 4 MG/2ML IJ SOLN
4.0000 mg | Freq: Once | INTRAMUSCULAR | Status: AC
  Administered 2011-09-10: 4 mg via INTRAVENOUS

## 2011-09-10 MED ORDER — METRONIDAZOLE 500 MG PO TABS
500.0000 mg | ORAL_TABLET | Freq: Once | ORAL | Status: AC
  Administered 2011-09-10: 500 mg via ORAL
  Filled 2011-09-10: qty 1

## 2011-09-10 MED ORDER — METRONIDAZOLE 500 MG PO TABS: 500.0000 mg | ORAL_TABLET | Freq: Two times a day (BID) | ORAL | Status: AC

## 2011-09-10 MED ORDER — CIPROFLOXACIN HCL 500 MG PO TABS: 500.0000 mg | ORAL_TABLET | Freq: Two times a day (BID) | ORAL | Status: AC

## 2011-09-10 MED ORDER — ONDANSETRON HCL 4 MG/2ML IJ SOLN
INTRAMUSCULAR | Status: AC
  Administered 2011-09-10: 4 mg via INTRAVENOUS
  Filled 2011-09-10: qty 2

## 2011-09-10 MED ORDER — CIPROFLOXACIN HCL 500 MG PO TABS
500.0000 mg | ORAL_TABLET | Freq: Once | ORAL | Status: AC
  Administered 2011-09-10: 500 mg via ORAL
  Filled 2011-09-10: qty 1

## 2011-09-10 MED ORDER — HYDROMORPHONE HCL PF 1 MG/ML IJ SOLN
1.0000 mg | Freq: Once | INTRAMUSCULAR | Status: AC
  Administered 2011-09-10: 1 mg via INTRAVENOUS
  Filled 2011-09-10: qty 1

## 2011-09-10 NOTE — ED Notes (Signed)
Told RN and EDP about pt sudden onset of nausea

## 2011-09-10 NOTE — Discharge Instructions (Signed)
Colitis  Colitis is inflammation of the colon. Colitis can be a short-term or long-standing (chronic) illness. Crohn's disease and ulcerative colitis are 2 types of colitis which are chronic. They usually require lifelong treatment.  CAUSES  There are many different causes of colitis, including:  Viruses.  Germs (bacteria).  Medicine reactions.  SYMPTOMS  Diarrhea.  Intestinal bleeding.  Pain.  Fever.  Throwing up (vomiting).  Tiredness (fatigue).  Weight loss.  Bowel blockage.  DIAGNOSIS  The diagnosis of colitis is based on examination and stool or blood tests. X-rays, CT scan, and colonoscopy may also be needed.  TREATMENT  Treatment may include:  Fluids given through the vein (intravenously).  Bowel rest (nothing to eat or drink for a period of time). Drink only clear liquids for the next 24-48 hours. Medicine for pain and diarrhea.  Medicines (antibiotics) that kill germs.  Cortisone medicines.  Surgery.  HOME CARE INSTRUCTIONS  Get plenty of rest.  Drink enough water and fluids to keep your urine clear or pale yellow.  Eat a well-balanced diet.  Call your caregiver for follow-up as recommended.  SEEK IMMEDIATE MEDICAL CARE IF:  You develop chills.  You have an oral temperature above 102 F (38.9 C), not controlled by medicine.  You have extreme weakness, fainting, or dehydration.  You have repeated vomiting.  You develop severe belly (abdominal) pain or are passing bloody or tarry stools.  MAKE SURE YOU:  Understand these instructions.  Will watch your condition.  Will get help right away if you are not doing well or get worse.

## 2011-09-11 ENCOUNTER — Other Ambulatory Visit: Payer: Self-pay | Admitting: Gastroenterology

## 2011-09-11 DIAGNOSIS — Z1211 Encounter for screening for malignant neoplasm of colon: Secondary | ICD-10-CM

## 2011-09-12 DIAGNOSIS — R109 Unspecified abdominal pain: Secondary | ICD-10-CM | POA: Diagnosis not present

## 2011-09-18 ENCOUNTER — Ambulatory Visit
Admission: RE | Admit: 2011-09-18 | Discharge: 2011-09-18 | Disposition: A | Discharge status: Home / Self Care | Payer: Medicare Other | Source: Ambulatory Visit | Attending: Gastroenterology | Admitting: Gastroenterology

## 2011-09-18 DIAGNOSIS — Z1211 Encounter for screening for malignant neoplasm of colon: Secondary | ICD-10-CM

## 2011-09-18 DIAGNOSIS — R948 Abnormal results of function studies of other organs and systems: Secondary | ICD-10-CM | POA: Diagnosis not present

## 2011-09-18 DIAGNOSIS — K5730 Diverticulosis of large intestine without perforation or abscess without bleeding: Secondary | ICD-10-CM | POA: Diagnosis not present

## 2011-09-19 ENCOUNTER — Other Ambulatory Visit (HOSPITAL_COMMUNITY)
Admission: RE | Admit: 2011-09-19 | Discharge: 2011-09-19 | Disposition: A | Discharge status: Home / Self Care | Payer: Medicare Other | Source: Ambulatory Visit | Attending: Obstetrics and Gynecology | Admitting: Obstetrics and Gynecology

## 2011-09-19 ENCOUNTER — Other Ambulatory Visit: Payer: Self-pay | Admitting: Obstetrics and Gynecology

## 2011-09-19 DIAGNOSIS — Z124 Encounter for screening for malignant neoplasm of cervix: Secondary | ICD-10-CM | POA: Insufficient documentation

## 2011-09-19 DIAGNOSIS — Z113 Encounter for screening for infections with a predominantly sexual mode of transmission: Secondary | ICD-10-CM | POA: Insufficient documentation

## 2011-09-19 DIAGNOSIS — Z01419 Encounter for gynecological examination (general) (routine) without abnormal findings: Secondary | ICD-10-CM | POA: Diagnosis not present

## 2011-09-19 DIAGNOSIS — N8111 Cystocele, midline: Secondary | ICD-10-CM | POA: Diagnosis not present

## 2011-09-19 DIAGNOSIS — N814 Uterovaginal prolapse, unspecified: Secondary | ICD-10-CM | POA: Diagnosis not present

## 2011-09-19 DIAGNOSIS — IMO0002 Reserved for concepts with insufficient information to code with codable children: Secondary | ICD-10-CM | POA: Diagnosis not present

## 2011-09-22 DIAGNOSIS — F4001 Agoraphobia with panic disorder: Secondary | ICD-10-CM | POA: Diagnosis not present

## 2011-09-22 DIAGNOSIS — F331 Major depressive disorder, recurrent, moderate: Secondary | ICD-10-CM | POA: Diagnosis not present

## 2011-10-13 ENCOUNTER — Other Ambulatory Visit: Payer: Self-pay | Admitting: Obstetrics and Gynecology

## 2011-10-13 DIAGNOSIS — Z1231 Encounter for screening mammogram for malignant neoplasm of breast: Secondary | ICD-10-CM

## 2011-11-03 ENCOUNTER — Ambulatory Visit
Admission: RE | Admit: 2011-11-03 | Discharge: 2011-11-03 | Disposition: A | Discharge status: Home / Self Care | Payer: Medicare Other | Source: Ambulatory Visit | Attending: Obstetrics and Gynecology | Admitting: Obstetrics and Gynecology

## 2011-11-03 DIAGNOSIS — Z1231 Encounter for screening mammogram for malignant neoplasm of breast: Secondary | ICD-10-CM | POA: Diagnosis not present

## 2011-12-07 DIAGNOSIS — IMO0002 Reserved for concepts with insufficient information to code with codable children: Secondary | ICD-10-CM | POA: Diagnosis not present

## 2011-12-27 DIAGNOSIS — J309 Allergic rhinitis, unspecified: Secondary | ICD-10-CM | POA: Diagnosis not present

## 2011-12-27 DIAGNOSIS — F341 Dysthymic disorder: Secondary | ICD-10-CM | POA: Diagnosis not present

## 2011-12-27 DIAGNOSIS — E7800 Pure hypercholesterolemia, unspecified: Secondary | ICD-10-CM | POA: Diagnosis not present

## 2011-12-27 DIAGNOSIS — I10 Essential (primary) hypertension: Secondary | ICD-10-CM | POA: Diagnosis not present

## 2012-01-16 DIAGNOSIS — L680 Hirsutism: Secondary | ICD-10-CM | POA: Diagnosis not present

## 2012-01-16 DIAGNOSIS — L708 Other acne: Secondary | ICD-10-CM | POA: Diagnosis not present

## 2012-03-07 DIAGNOSIS — M48061 Spinal stenosis, lumbar region without neurogenic claudication: Secondary | ICD-10-CM | POA: Diagnosis not present

## 2012-03-07 DIAGNOSIS — G894 Chronic pain syndrome: Secondary | ICD-10-CM | POA: Diagnosis not present

## 2012-03-27 DIAGNOSIS — M6789 Other specified disorders of synovium and tendon, multiple sites: Secondary | ICD-10-CM | POA: Diagnosis not present

## 2012-04-18 DIAGNOSIS — M6789 Other specified disorders of synovium and tendon, multiple sites: Secondary | ICD-10-CM | POA: Diagnosis not present

## 2012-04-24 DIAGNOSIS — M6789 Other specified disorders of synovium and tendon, multiple sites: Secondary | ICD-10-CM | POA: Diagnosis not present

## 2012-04-25 DIAGNOSIS — H16109 Unspecified superficial keratitis, unspecified eye: Secondary | ICD-10-CM | POA: Diagnosis not present

## 2012-04-25 DIAGNOSIS — H04129 Dry eye syndrome of unspecified lacrimal gland: Secondary | ICD-10-CM | POA: Diagnosis not present

## 2012-05-20 DIAGNOSIS — H16229 Keratoconjunctivitis sicca, not specified as Sjogren's, unspecified eye: Secondary | ICD-10-CM | POA: Diagnosis not present

## 2012-05-20 DIAGNOSIS — H16009 Unspecified corneal ulcer, unspecified eye: Secondary | ICD-10-CM | POA: Diagnosis not present

## 2012-06-04 DIAGNOSIS — H1045 Other chronic allergic conjunctivitis: Secondary | ICD-10-CM | POA: Diagnosis not present

## 2012-06-04 DIAGNOSIS — H16229 Keratoconjunctivitis sicca, not specified as Sjogren's, unspecified eye: Secondary | ICD-10-CM | POA: Diagnosis not present

## 2012-06-19 DIAGNOSIS — M6240 Contracture of muscle, unspecified site: Secondary | ICD-10-CM | POA: Diagnosis not present

## 2012-07-16 DIAGNOSIS — E663 Overweight: Secondary | ICD-10-CM | POA: Diagnosis not present

## 2012-07-16 DIAGNOSIS — F411 Generalized anxiety disorder: Secondary | ICD-10-CM | POA: Diagnosis not present

## 2012-07-16 DIAGNOSIS — E7800 Pure hypercholesterolemia, unspecified: Secondary | ICD-10-CM | POA: Diagnosis not present

## 2012-07-16 DIAGNOSIS — I10 Essential (primary) hypertension: Secondary | ICD-10-CM | POA: Diagnosis not present

## 2012-07-16 DIAGNOSIS — Z0000 Encounter for general adult medical examination without abnormal findings: Secondary | ICD-10-CM | POA: Diagnosis not present

## 2012-07-25 DIAGNOSIS — M48061 Spinal stenosis, lumbar region without neurogenic claudication: Secondary | ICD-10-CM | POA: Diagnosis not present

## 2012-07-25 DIAGNOSIS — G894 Chronic pain syndrome: Secondary | ICD-10-CM | POA: Diagnosis not present

## 2012-07-29 DIAGNOSIS — H16109 Unspecified superficial keratitis, unspecified eye: Secondary | ICD-10-CM | POA: Diagnosis not present

## 2012-07-29 DIAGNOSIS — H04129 Dry eye syndrome of unspecified lacrimal gland: Secondary | ICD-10-CM | POA: Diagnosis not present

## 2012-08-28 ENCOUNTER — Other Ambulatory Visit: Payer: Self-pay | Admitting: Obstetrics and Gynecology

## 2012-08-28 ENCOUNTER — Other Ambulatory Visit (HOSPITAL_COMMUNITY)
Admission: RE | Admit: 2012-08-28 | Discharge: 2012-08-28 | Disposition: A | Discharge status: Home / Self Care | Payer: Medicare Other | Source: Ambulatory Visit | Attending: Obstetrics and Gynecology | Admitting: Obstetrics and Gynecology

## 2012-08-28 DIAGNOSIS — Z01419 Encounter for gynecological examination (general) (routine) without abnormal findings: Secondary | ICD-10-CM | POA: Diagnosis not present

## 2012-08-28 DIAGNOSIS — N814 Uterovaginal prolapse, unspecified: Secondary | ICD-10-CM | POA: Diagnosis not present

## 2012-08-28 DIAGNOSIS — N8111 Cystocele, midline: Secondary | ICD-10-CM | POA: Diagnosis not present

## 2012-08-28 DIAGNOSIS — Z124 Encounter for screening for malignant neoplasm of cervix: Secondary | ICD-10-CM | POA: Insufficient documentation

## 2012-09-05 DIAGNOSIS — L908 Other atrophic disorders of skin: Secondary | ICD-10-CM | POA: Diagnosis not present

## 2012-09-05 DIAGNOSIS — L918 Other hypertrophic disorders of the skin: Secondary | ICD-10-CM | POA: Diagnosis not present

## 2012-09-05 DIAGNOSIS — L819 Disorder of pigmentation, unspecified: Secondary | ICD-10-CM | POA: Diagnosis not present

## 2012-10-09 ENCOUNTER — Other Ambulatory Visit: Payer: Self-pay | Admitting: Obstetrics and Gynecology

## 2012-10-09 DIAGNOSIS — Z1231 Encounter for screening mammogram for malignant neoplasm of breast: Secondary | ICD-10-CM

## 2012-10-19 DIAGNOSIS — L298 Other pruritus: Secondary | ICD-10-CM | POA: Diagnosis not present

## 2012-11-04 ENCOUNTER — Ambulatory Visit
Admission: RE | Admit: 2012-11-04 | Discharge: 2012-11-04 | Disposition: A | Discharge status: Home / Self Care | Payer: Medicare Other | Source: Ambulatory Visit | Attending: Obstetrics and Gynecology | Admitting: Obstetrics and Gynecology

## 2012-11-04 DIAGNOSIS — Z1231 Encounter for screening mammogram for malignant neoplasm of breast: Secondary | ICD-10-CM | POA: Diagnosis not present

## 2012-11-19 DIAGNOSIS — H04129 Dry eye syndrome of unspecified lacrimal gland: Secondary | ICD-10-CM | POA: Diagnosis not present

## 2012-12-20 DIAGNOSIS — H04129 Dry eye syndrome of unspecified lacrimal gland: Secondary | ICD-10-CM | POA: Diagnosis not present

## 2013-01-07 DIAGNOSIS — F341 Dysthymic disorder: Secondary | ICD-10-CM | POA: Diagnosis not present

## 2013-01-07 DIAGNOSIS — I10 Essential (primary) hypertension: Secondary | ICD-10-CM | POA: Diagnosis not present

## 2013-01-07 DIAGNOSIS — E7800 Pure hypercholesterolemia, unspecified: Secondary | ICD-10-CM | POA: Diagnosis not present

## 2013-01-16 ENCOUNTER — Encounter (HOSPITAL_COMMUNITY): Payer: Self-pay | Admitting: *Deleted

## 2013-01-16 ENCOUNTER — Emergency Department (HOSPITAL_COMMUNITY): Payer: Medicare Other

## 2013-01-16 ENCOUNTER — Emergency Department (HOSPITAL_COMMUNITY)
Admission: EM | Admit: 2013-01-16 | Discharge: 2013-01-16 | Disposition: A | Discharge status: Home / Self Care | Payer: Medicare Other | Attending: Emergency Medicine | Admitting: Emergency Medicine

## 2013-01-16 DIAGNOSIS — Z79899 Other long term (current) drug therapy: Secondary | ICD-10-CM | POA: Diagnosis not present

## 2013-01-16 DIAGNOSIS — Z880 Allergy status to penicillin: Secondary | ICD-10-CM | POA: Insufficient documentation

## 2013-01-16 DIAGNOSIS — S4980XA Other specified injuries of shoulder and upper arm, unspecified arm, initial encounter: Secondary | ICD-10-CM | POA: Diagnosis not present

## 2013-01-16 DIAGNOSIS — S8990XA Unspecified injury of unspecified lower leg, initial encounter: Secondary | ICD-10-CM | POA: Diagnosis not present

## 2013-01-16 DIAGNOSIS — S46909A Unspecified injury of unspecified muscle, fascia and tendon at shoulder and upper arm level, unspecified arm, initial encounter: Secondary | ICD-10-CM | POA: Insufficient documentation

## 2013-01-16 DIAGNOSIS — IMO0002 Reserved for concepts with insufficient information to code with codable children: Secondary | ICD-10-CM | POA: Diagnosis not present

## 2013-01-16 DIAGNOSIS — M25519 Pain in unspecified shoulder: Secondary | ICD-10-CM | POA: Diagnosis not present

## 2013-01-16 DIAGNOSIS — S99929A Unspecified injury of unspecified foot, initial encounter: Secondary | ICD-10-CM | POA: Diagnosis not present

## 2013-01-16 DIAGNOSIS — S199XXA Unspecified injury of neck, initial encounter: Secondary | ICD-10-CM | POA: Insufficient documentation

## 2013-01-16 DIAGNOSIS — I10 Essential (primary) hypertension: Secondary | ICD-10-CM | POA: Diagnosis not present

## 2013-01-16 DIAGNOSIS — G8929 Other chronic pain: Secondary | ICD-10-CM | POA: Insufficient documentation

## 2013-01-16 DIAGNOSIS — Y9389 Activity, other specified: Secondary | ICD-10-CM | POA: Insufficient documentation

## 2013-01-16 DIAGNOSIS — M542 Cervicalgia: Secondary | ICD-10-CM | POA: Diagnosis not present

## 2013-01-16 DIAGNOSIS — M545 Low back pain: Secondary | ICD-10-CM | POA: Diagnosis not present

## 2013-01-16 DIAGNOSIS — S99919A Unspecified injury of unspecified ankle, initial encounter: Secondary | ICD-10-CM | POA: Diagnosis not present

## 2013-01-16 DIAGNOSIS — Y92410 Unspecified street and highway as the place of occurrence of the external cause: Secondary | ICD-10-CM | POA: Insufficient documentation

## 2013-01-16 DIAGNOSIS — S0993XA Unspecified injury of face, initial encounter: Secondary | ICD-10-CM | POA: Insufficient documentation

## 2013-01-16 DIAGNOSIS — M25569 Pain in unspecified knee: Secondary | ICD-10-CM | POA: Diagnosis not present

## 2013-01-16 HISTORY — DX: Other chronic pain: G89.29

## 2013-01-16 HISTORY — DX: Dorsalgia, unspecified: M54.9

## 2013-01-16 MED ORDER — HYDROCODONE-ACETAMINOPHEN 5-325 MG PO TABS: 1.0000 | ORAL_TABLET | Freq: Four times a day (QID) | ORAL | Status: DC | PRN

## 2013-01-16 NOTE — ED Notes (Signed)
Pt requested second knee sleeve for other knee which was given.
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
## 2013-01-17 NOTE — ED Provider Notes (Signed)
Medical screening examination/treatment/procedure(s) were performed by non-physician practitioner and as supervising physician I was immediately available for consultation/collaboration.    Davide Risdon D Maximillian Habibi, MD 01/17/13 0704 

## 2013-01-21 ENCOUNTER — Emergency Department (HOSPITAL_COMMUNITY): Payer: No Typology Code available for payment source

## 2013-01-21 ENCOUNTER — Emergency Department (HOSPITAL_COMMUNITY)
Admission: EM | Admit: 2013-01-21 | Discharge: 2013-01-21 | Disposition: A | Discharge status: Home / Self Care | Payer: No Typology Code available for payment source | Attending: Emergency Medicine | Admitting: Emergency Medicine

## 2013-01-21 ENCOUNTER — Encounter (HOSPITAL_COMMUNITY): Payer: Self-pay | Admitting: *Deleted

## 2013-01-21 DIAGNOSIS — S199XXA Unspecified injury of neck, initial encounter: Secondary | ICD-10-CM | POA: Diagnosis not present

## 2013-01-21 DIAGNOSIS — Z79899 Other long term (current) drug therapy: Secondary | ICD-10-CM | POA: Insufficient documentation

## 2013-01-21 DIAGNOSIS — Y9389 Activity, other specified: Secondary | ICD-10-CM | POA: Insufficient documentation

## 2013-01-21 DIAGNOSIS — Z8739 Personal history of other diseases of the musculoskeletal system and connective tissue: Secondary | ICD-10-CM | POA: Insufficient documentation

## 2013-01-21 DIAGNOSIS — S0993XA Unspecified injury of face, initial encounter: Secondary | ICD-10-CM | POA: Diagnosis not present

## 2013-01-21 DIAGNOSIS — Y92410 Unspecified street and highway as the place of occurrence of the external cause: Secondary | ICD-10-CM | POA: Insufficient documentation

## 2013-01-21 DIAGNOSIS — L821 Other seborrheic keratosis: Secondary | ICD-10-CM | POA: Diagnosis not present

## 2013-01-21 DIAGNOSIS — M5412 Radiculopathy, cervical region: Secondary | ICD-10-CM

## 2013-01-21 DIAGNOSIS — S0990XA Unspecified injury of head, initial encounter: Secondary | ICD-10-CM | POA: Diagnosis not present

## 2013-01-21 DIAGNOSIS — M542 Cervicalgia: Secondary | ICD-10-CM | POA: Diagnosis not present

## 2013-01-21 DIAGNOSIS — I10 Essential (primary) hypertension: Secondary | ICD-10-CM | POA: Insufficient documentation

## 2013-01-21 MED ORDER — HYDROCODONE-ACETAMINOPHEN 5-325 MG PO TABS: 1.0000 | ORAL_TABLET | Freq: Four times a day (QID) | ORAL | Status: DC | PRN

## 2013-01-21 MED ORDER — MORPHINE SULFATE 4 MG/ML IJ SOLN
4.0000 mg | Freq: Once | INTRAMUSCULAR | Status: AC
  Administered 2013-01-21: 4 mg via INTRAVENOUS
  Filled 2013-01-21: qty 1

## 2013-01-21 MED ORDER — NAPROXEN 375 MG PO TABS: 375.0000 mg | ORAL_TABLET | Freq: Two times a day (BID) | ORAL | Status: DC

## 2013-01-21 MED ORDER — PREDNISONE 20 MG PO TABS: 20.0000 mg | ORAL_TABLET | Freq: Every day | ORAL | Status: DC

## 2013-01-21 NOTE — ED Notes (Signed)
Patient transported to CT 
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
## 2013-01-21 NOTE — ED Notes (Signed)
PT placed in c-collar.

## 2013-01-21 NOTE — ED Notes (Signed)
Spoke with Ester, pts daughter, re: picking up pt for transport home d/t medication admin
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
## 2013-01-22 NOTE — ED Provider Notes (Signed)
Medical screening examination/treatment/procedure(s) were performed by non-physician practitioner and as supervising physician I was immediately available for consultation/collaboration.  Felicita Nuncio L Jaquita Bessire, MD 01/22/13 1753 

## 2013-01-23 NOTE — ED Provider Notes (Signed)
Medical screening examination/treatment/procedure(s) were performed by non-physician practitioner and as supervising physician I was immediately available for consultation/collaboration.   Shikita Vaillancourt B. Tierany Appleby, MD 01/23/13 1554 

## 2013-01-24 DIAGNOSIS — M25569 Pain in unspecified knee: Secondary | ICD-10-CM | POA: Diagnosis not present

## 2013-01-24 DIAGNOSIS — M542 Cervicalgia: Secondary | ICD-10-CM | POA: Diagnosis not present

## 2013-01-24 DIAGNOSIS — M25519 Pain in unspecified shoulder: Secondary | ICD-10-CM | POA: Diagnosis not present

## 2013-01-28 DIAGNOSIS — M5412 Radiculopathy, cervical region: Secondary | ICD-10-CM | POA: Diagnosis not present

## 2013-01-28 DIAGNOSIS — M5030 Other cervical disc degeneration, unspecified cervical region: Secondary | ICD-10-CM | POA: Diagnosis not present

## 2013-01-28 DIAGNOSIS — M542 Cervicalgia: Secondary | ICD-10-CM | POA: Diagnosis not present

## 2013-01-28 DIAGNOSIS — M25519 Pain in unspecified shoulder: Secondary | ICD-10-CM | POA: Diagnosis not present

## 2013-02-12 DIAGNOSIS — M5030 Other cervical disc degeneration, unspecified cervical region: Secondary | ICD-10-CM | POA: Diagnosis not present

## 2013-02-12 DIAGNOSIS — M25519 Pain in unspecified shoulder: Secondary | ICD-10-CM | POA: Diagnosis not present

## 2013-02-12 DIAGNOSIS — M48061 Spinal stenosis, lumbar region without neurogenic claudication: Secondary | ICD-10-CM | POA: Diagnosis not present

## 2013-02-12 DIAGNOSIS — M542 Cervicalgia: Secondary | ICD-10-CM | POA: Diagnosis not present

## 2013-02-20 DIAGNOSIS — S139XXA Sprain of joints and ligaments of unspecified parts of neck, initial encounter: Secondary | ICD-10-CM | POA: Diagnosis not present

## 2013-02-20 DIAGNOSIS — S335XXA Sprain of ligaments of lumbar spine, initial encounter: Secondary | ICD-10-CM | POA: Diagnosis not present

## 2013-02-24 DIAGNOSIS — S335XXA Sprain of ligaments of lumbar spine, initial encounter: Secondary | ICD-10-CM | POA: Diagnosis not present

## 2013-02-24 DIAGNOSIS — S139XXA Sprain of joints and ligaments of unspecified parts of neck, initial encounter: Secondary | ICD-10-CM | POA: Diagnosis not present

## 2013-02-26 DIAGNOSIS — S139XXA Sprain of joints and ligaments of unspecified parts of neck, initial encounter: Secondary | ICD-10-CM | POA: Diagnosis not present

## 2013-03-03 DIAGNOSIS — S139XXA Sprain of joints and ligaments of unspecified parts of neck, initial encounter: Secondary | ICD-10-CM | POA: Diagnosis not present

## 2013-03-05 DIAGNOSIS — S139XXA Sprain of joints and ligaments of unspecified parts of neck, initial encounter: Secondary | ICD-10-CM | POA: Diagnosis not present

## 2013-03-05 DIAGNOSIS — M5030 Other cervical disc degeneration, unspecified cervical region: Secondary | ICD-10-CM | POA: Diagnosis not present

## 2013-03-05 DIAGNOSIS — M542 Cervicalgia: Secondary | ICD-10-CM | POA: Diagnosis not present

## 2013-03-05 DIAGNOSIS — S335XXA Sprain of ligaments of lumbar spine, initial encounter: Secondary | ICD-10-CM | POA: Diagnosis not present

## 2013-03-10 ENCOUNTER — Encounter: Payer: Self-pay | Admitting: Neurology

## 2013-03-10 DIAGNOSIS — M5030 Other cervical disc degeneration, unspecified cervical region: Secondary | ICD-10-CM | POA: Diagnosis not present

## 2013-03-11 ENCOUNTER — Other Ambulatory Visit: Payer: Self-pay | Admitting: Neurology

## 2013-03-11 ENCOUNTER — Encounter: Payer: Self-pay | Admitting: Neurology

## 2013-03-11 ENCOUNTER — Ambulatory Visit (INDEPENDENT_AMBULATORY_CARE_PROVIDER_SITE_OTHER): Payer: Medicare Other | Admitting: Neurology

## 2013-03-11 VITALS — BP 121/83 | HR 73 | Wt 171.0 lb

## 2013-03-11 DIAGNOSIS — R209 Unspecified disturbances of skin sensation: Secondary | ICD-10-CM

## 2013-03-11 DIAGNOSIS — M542 Cervicalgia: Secondary | ICD-10-CM | POA: Diagnosis not present

## 2013-03-11 DIAGNOSIS — R200 Anesthesia of skin: Secondary | ICD-10-CM | POA: Insufficient documentation

## 2013-03-11 DIAGNOSIS — R413 Other amnesia: Secondary | ICD-10-CM | POA: Diagnosis not present
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
## 2013-03-12 DIAGNOSIS — M5030 Other cervical disc degeneration, unspecified cervical region: Secondary | ICD-10-CM | POA: Diagnosis not present

## 2013-03-12 LAB — RPR: RPR: NONREACTIVE

## 2013-03-12 LAB — ANA: Anti Nuclear Antibody(ANA): NEGATIVE

## 2013-03-12 LAB — COMPREHENSIVE METABOLIC PANEL
ALT: 19 IU/L (ref 0–32)
Albumin/Globulin Ratio: 1.7 (ref 1.1–2.5)
CO2: 25 mmol/L (ref 18–29)
Calcium: 9.5 mg/dL (ref 8.6–10.2)
Creatinine, Ser: 0.98 mg/dL (ref 0.57–1.00)
GFR calc non Af Amer: 62 mL/min/{1.73_m2} (ref >59)
Glucose: 87 mg/dL (ref 65–99)
Potassium: 3.4 mmol/L — ABNORMAL LOW (ref 3.5–5.2)
Total Protein: 7 g/dL (ref 6.0–8.5)

## 2013-03-12 LAB — THYROID PANEL WITH TSH
Free Thyroxine Index: 1.9 (ref 1.2–4.9)
T3 Uptake Ratio: 27 % (ref 24–39)
T4, Total: 7.2 ug/dL (ref 4.5–12.0)
TSH: 1.95 u[IU]/mL (ref 0.450–4.500)

## 2013-03-12 LAB — CBC
HCT: 40.5 % (ref 34.0–46.6)
Hemoglobin: 13.4 g/dL (ref 11.1–15.9)
MCHC: 33.1 g/dL (ref 31.5–35.7)
RBC: 4.66 x10E6/uL (ref 3.77–5.28)

## 2013-03-12 NOTE — Progress Notes (Signed)
Quick Note:  Left message for patient with normal results, per Dr. Terrace Arabia. ______

## 2013-03-19 DIAGNOSIS — M5030 Other cervical disc degeneration, unspecified cervical region: Secondary | ICD-10-CM | POA: Diagnosis not present

## 2013-03-20 ENCOUNTER — Ambulatory Visit: Payer: Medicare Other | Attending: Neurology | Admitting: Physical Therapy

## 2013-03-20 DIAGNOSIS — M542 Cervicalgia: Secondary | ICD-10-CM | POA: Diagnosis not present

## 2013-03-20 DIAGNOSIS — R5381 Other malaise: Secondary | ICD-10-CM | POA: Insufficient documentation

## 2013-03-20 DIAGNOSIS — S335XXA Sprain of ligaments of lumbar spine, initial encounter: Secondary | ICD-10-CM | POA: Diagnosis not present

## 2013-03-20 DIAGNOSIS — IMO0001 Reserved for inherently not codable concepts without codable children: Secondary | ICD-10-CM | POA: Diagnosis not present

## 2013-03-20 DIAGNOSIS — S139XXA Sprain of joints and ligaments of unspecified parts of neck, initial encounter: Secondary | ICD-10-CM | POA: Diagnosis not present

## 2013-03-20 DIAGNOSIS — M6281 Muscle weakness (generalized): Secondary | ICD-10-CM | POA: Insufficient documentation

## 2013-03-20 DIAGNOSIS — M2550 Pain in unspecified joint: Secondary | ICD-10-CM | POA: Diagnosis not present

## 2013-03-20 DIAGNOSIS — M5030 Other cervical disc degeneration, unspecified cervical region: Secondary | ICD-10-CM | POA: Diagnosis not present

## 2013-03-25 ENCOUNTER — Ambulatory Visit: Payer: No Typology Code available for payment source | Attending: Neurology | Admitting: Physical Therapy

## 2013-03-25 DIAGNOSIS — IMO0001 Reserved for inherently not codable concepts without codable children: Secondary | ICD-10-CM | POA: Insufficient documentation

## 2013-03-25 DIAGNOSIS — M2550 Pain in unspecified joint: Secondary | ICD-10-CM | POA: Diagnosis not present

## 2013-03-25 DIAGNOSIS — M6281 Muscle weakness (generalized): Secondary | ICD-10-CM | POA: Diagnosis not present

## 2013-03-25 DIAGNOSIS — R5381 Other malaise: Secondary | ICD-10-CM | POA: Insufficient documentation

## 2013-03-26 DIAGNOSIS — H16109 Unspecified superficial keratitis, unspecified eye: Secondary | ICD-10-CM | POA: Diagnosis not present

## 2013-03-26 DIAGNOSIS — H04129 Dry eye syndrome of unspecified lacrimal gland: Secondary | ICD-10-CM | POA: Diagnosis not present

## 2013-03-27 ENCOUNTER — Telehealth: Payer: Self-pay

## 2013-03-27 ENCOUNTER — Ambulatory Visit: Payer: No Typology Code available for payment source

## 2013-03-27 DIAGNOSIS — M2550 Pain in unspecified joint: Secondary | ICD-10-CM | POA: Diagnosis not present

## 2013-03-27 DIAGNOSIS — IMO0001 Reserved for inherently not codable concepts without codable children: Secondary | ICD-10-CM | POA: Diagnosis not present

## 2013-03-27 DIAGNOSIS — M6281 Muscle weakness (generalized): Secondary | ICD-10-CM | POA: Diagnosis not present

## 2013-03-27 DIAGNOSIS — R5381 Other malaise: Secondary | ICD-10-CM | POA: Diagnosis not present

## 2013-03-27 NOTE — Telephone Encounter (Signed)
Patient is requesting Xanax for MRI.  Dr Terrace Arabia is out of the office.  Forwarding request to Dr Anne Hahn, [facility].  Okay to dispense?  Please advise.  Thank you.

## 2013-03-27 NOTE — Telephone Encounter (Signed)
Okay to dispense Xanax for MRI.

## 2013-03-27 NOTE — Telephone Encounter (Signed)
Dispensed Alprazolam 0.5mg  #3 Lot [id number] Exp 05/2014.  I called the patient, got no answer.  Left message.

## 2013-03-29 ENCOUNTER — Other Ambulatory Visit: Payer: Medicare Other

## 2013-03-31 ENCOUNTER — Ambulatory Visit: Payer: No Typology Code available for payment source

## 2013-03-31 DIAGNOSIS — R5381 Other malaise: Secondary | ICD-10-CM | POA: Diagnosis not present

## 2013-03-31 DIAGNOSIS — IMO0001 Reserved for inherently not codable concepts without codable children: Secondary | ICD-10-CM | POA: Diagnosis not present

## 2013-03-31 DIAGNOSIS — M2550 Pain in unspecified joint: Secondary | ICD-10-CM | POA: Diagnosis not present

## 2013-03-31 DIAGNOSIS — M6281 Muscle weakness (generalized): Secondary | ICD-10-CM | POA: Diagnosis not present

## 2013-04-01 ENCOUNTER — Ambulatory Visit
Admission: RE | Admit: 2013-04-01 | Discharge: 2013-04-01 | Disposition: A | Discharge status: Home / Self Care | Payer: Medicare Other | Source: Ambulatory Visit | Attending: Neurology | Admitting: Neurology

## 2013-04-01 DIAGNOSIS — R413 Other amnesia: Secondary | ICD-10-CM | POA: Diagnosis not present

## 2013-04-01 DIAGNOSIS — M542 Cervicalgia: Secondary | ICD-10-CM

## 2013-04-01 DIAGNOSIS — R209 Unspecified disturbances of skin sensation: Secondary | ICD-10-CM | POA: Diagnosis not present

## 2013-04-01 DIAGNOSIS — R200 Anesthesia of skin: Secondary | ICD-10-CM

## 2013-04-02 ENCOUNTER — Ambulatory Visit: Payer: No Typology Code available for payment source | Admitting: Physical Therapy

## 2013-04-02 ENCOUNTER — Other Ambulatory Visit: Payer: Medicare Other

## 2013-04-02 DIAGNOSIS — E7800 Pure hypercholesterolemia, unspecified: Secondary | ICD-10-CM | POA: Diagnosis not present

## 2013-04-02 DIAGNOSIS — M2550 Pain in unspecified joint: Secondary | ICD-10-CM | POA: Diagnosis not present

## 2013-04-02 DIAGNOSIS — R5381 Other malaise: Secondary | ICD-10-CM | POA: Diagnosis not present

## 2013-04-02 DIAGNOSIS — IMO0001 Reserved for inherently not codable concepts without codable children: Secondary | ICD-10-CM | POA: Diagnosis not present

## 2013-04-02 DIAGNOSIS — R350 Frequency of micturition: Secondary | ICD-10-CM | POA: Diagnosis not present

## 2013-04-02 DIAGNOSIS — M6281 Muscle weakness (generalized): Secondary | ICD-10-CM | POA: Diagnosis not present

## 2013-04-07 ENCOUNTER — Ambulatory Visit: Payer: No Typology Code available for payment source | Admitting: Physical Therapy

## 2013-04-07 DIAGNOSIS — M6281 Muscle weakness (generalized): Secondary | ICD-10-CM | POA: Diagnosis not present

## 2013-04-07 DIAGNOSIS — M2550 Pain in unspecified joint: Secondary | ICD-10-CM | POA: Diagnosis not present

## 2013-04-07 DIAGNOSIS — IMO0001 Reserved for inherently not codable concepts without codable children: Secondary | ICD-10-CM | POA: Diagnosis not present

## 2013-04-07 DIAGNOSIS — R5381 Other malaise: Secondary | ICD-10-CM | POA: Diagnosis not present

## 2013-04-09 ENCOUNTER — Telehealth: Payer: Self-pay | Admitting: Neurology

## 2013-04-09 ENCOUNTER — Ambulatory Visit: Payer: No Typology Code available for payment source

## 2013-04-09 DIAGNOSIS — R5381 Other malaise: Secondary | ICD-10-CM | POA: Diagnosis not present

## 2013-04-09 DIAGNOSIS — M6281 Muscle weakness (generalized): Secondary | ICD-10-CM | POA: Diagnosis not present

## 2013-04-09 DIAGNOSIS — M2550 Pain in unspecified joint: Secondary | ICD-10-CM | POA: Diagnosis not present

## 2013-04-09 DIAGNOSIS — IMO0001 Reserved for inherently not codable concepts without codable children: Secondary | ICD-10-CM | POA: Diagnosis not present

## 2013-04-09 NOTE — Telephone Encounter (Signed)
Pt came into office to get results for her MRI, needs someone to call her concerning her results. Thanks

## 2013-04-09 NOTE — Telephone Encounter (Signed)
Spoke with patient and relayed the results of their MRI.  The patient understood and had no questions.

## 2013-04-14 ENCOUNTER — Ambulatory Visit: Payer: No Typology Code available for payment source | Admitting: Physical Therapy

## 2013-04-14 ENCOUNTER — Encounter: Payer: Medicare Other | Admitting: Physical Therapy

## 2013-04-14 DIAGNOSIS — IMO0001 Reserved for inherently not codable concepts without codable children: Secondary | ICD-10-CM | POA: Diagnosis not present

## 2013-04-14 DIAGNOSIS — R5381 Other malaise: Secondary | ICD-10-CM | POA: Diagnosis not present

## 2013-04-14 DIAGNOSIS — M6281 Muscle weakness (generalized): Secondary | ICD-10-CM | POA: Diagnosis not present

## 2013-04-14 DIAGNOSIS — M2550 Pain in unspecified joint: Secondary | ICD-10-CM | POA: Diagnosis not present

## 2013-04-16 ENCOUNTER — Encounter: Payer: Medicare Other | Admitting: Physical Therapy

## 2013-04-17 DIAGNOSIS — G894 Chronic pain syndrome: Secondary | ICD-10-CM | POA: Diagnosis not present

## 2013-04-17 DIAGNOSIS — M5030 Other cervical disc degeneration, unspecified cervical region: Secondary | ICD-10-CM | POA: Diagnosis not present

## 2013-04-17 DIAGNOSIS — M542 Cervicalgia: Secondary | ICD-10-CM | POA: Diagnosis not present

## 2013-04-17 DIAGNOSIS — S335XXA Sprain of ligaments of lumbar spine, initial encounter: Secondary | ICD-10-CM | POA: Diagnosis not present

## 2013-04-22 ENCOUNTER — Ambulatory Visit: Payer: Medicare Other | Admitting: Physical Therapy

## 2013-04-24 ENCOUNTER — Ambulatory Visit: Payer: Medicare Other | Admitting: Physical Therapy

## 2013-05-01 DIAGNOSIS — R198 Other specified symptoms and signs involving the digestive system and abdomen: Secondary | ICD-10-CM | POA: Diagnosis not present

## 2013-05-01 DIAGNOSIS — R350 Frequency of micturition: Secondary | ICD-10-CM | POA: Diagnosis not present

## 2013-05-01 DIAGNOSIS — R109 Unspecified abdominal pain: Secondary | ICD-10-CM | POA: Diagnosis not present

## 2013-05-05 DIAGNOSIS — H04129 Dry eye syndrome of unspecified lacrimal gland: Secondary | ICD-10-CM | POA: Diagnosis not present

## 2013-05-09 DIAGNOSIS — M25569 Pain in unspecified knee: Secondary | ICD-10-CM | POA: Diagnosis not present

## 2013-05-12 DIAGNOSIS — S139XXA Sprain of joints and ligaments of unspecified parts of neck, initial encounter: Secondary | ICD-10-CM | POA: Diagnosis not present

## 2013-05-12 DIAGNOSIS — S335XXA Sprain of ligaments of lumbar spine, initial encounter: Secondary | ICD-10-CM | POA: Diagnosis not present

## 2013-05-15 DIAGNOSIS — S335XXA Sprain of ligaments of lumbar spine, initial encounter: Secondary | ICD-10-CM | POA: Diagnosis not present

## 2013-05-16 DIAGNOSIS — M542 Cervicalgia: Secondary | ICD-10-CM | POA: Diagnosis not present

## 2013-05-16 DIAGNOSIS — G894 Chronic pain syndrome: Secondary | ICD-10-CM | POA: Diagnosis not present

## 2013-05-16 DIAGNOSIS — S335XXA Sprain of ligaments of lumbar spine, initial encounter: Secondary | ICD-10-CM | POA: Diagnosis not present

## 2013-05-19 DIAGNOSIS — S335XXA Sprain of ligaments of lumbar spine, initial encounter: Secondary | ICD-10-CM | POA: Diagnosis not present

## 2013-05-22 DIAGNOSIS — Z0289 Encounter for other administrative examinations: Secondary | ICD-10-CM

## 2013-05-22 DIAGNOSIS — S335XXA Sprain of ligaments of lumbar spine, initial encounter: Secondary | ICD-10-CM | POA: Diagnosis not present

## 2013-05-22 DIAGNOSIS — N949 Unspecified condition associated with female genital organs and menstrual cycle: Secondary | ICD-10-CM | POA: Diagnosis not present

## 2013-05-27 DIAGNOSIS — S335XXA Sprain of ligaments of lumbar spine, initial encounter: Secondary | ICD-10-CM | POA: Diagnosis not present

## 2013-05-28 DIAGNOSIS — N949 Unspecified condition associated with female genital organs and menstrual cycle: Secondary | ICD-10-CM | POA: Diagnosis not present

## 2013-05-30 DIAGNOSIS — S335XXA Sprain of ligaments of lumbar spine, initial encounter: Secondary | ICD-10-CM | POA: Diagnosis not present

## 2013-06-03 DIAGNOSIS — S335XXA Sprain of ligaments of lumbar spine, initial encounter: Secondary | ICD-10-CM | POA: Diagnosis not present

## 2013-06-05 DIAGNOSIS — S139XXA Sprain of joints and ligaments of unspecified parts of neck, initial encounter: Secondary | ICD-10-CM | POA: Diagnosis not present

## 2013-06-11 ENCOUNTER — Ambulatory Visit (INDEPENDENT_AMBULATORY_CARE_PROVIDER_SITE_OTHER): Payer: Medicare Other | Admitting: Nurse Practitioner

## 2013-06-11 ENCOUNTER — Encounter: Payer: Self-pay | Admitting: Nurse Practitioner

## 2013-06-11 VITALS — BP 128/83 | HR 95 | Temp 98.5°F | Ht 65.0 in | Wt 173.0 lb

## 2013-06-11 DIAGNOSIS — R413 Other amnesia: Secondary | ICD-10-CM | POA: Diagnosis not present

## 2013-06-11 DIAGNOSIS — R209 Unspecified disturbances of skin sensation: Secondary | ICD-10-CM

## 2013-06-11 DIAGNOSIS — M542 Cervicalgia: Secondary | ICD-10-CM

## 2013-06-11 DIAGNOSIS — R200 Anesthesia of skin: Secondary | ICD-10-CM

## 2013-06-11 NOTE — Patient Instructions (Signed)
1.Her neck pain is most consistent with musculoskeletal pain, I have suggested physical therapy, hot compression, NSAIDs as needed.  2. Continue Meclizine as needed for Vertigo. 2. return to clinic in 6 months, sooner as needed.
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
## 2013-06-13 DIAGNOSIS — S139XXA Sprain of joints and ligaments of unspecified parts of neck, initial encounter: Secondary | ICD-10-CM | POA: Diagnosis not present

## 2013-06-16 DIAGNOSIS — S335XXA Sprain of ligaments of lumbar spine, initial encounter: Secondary | ICD-10-CM | POA: Diagnosis not present

## 2013-06-25 DIAGNOSIS — R188 Other ascites: Secondary | ICD-10-CM | POA: Diagnosis not present

## 2013-06-25 DIAGNOSIS — S335XXA Sprain of ligaments of lumbar spine, initial encounter: Secondary | ICD-10-CM | POA: Diagnosis not present

## 2013-07-22 DIAGNOSIS — Z0000 Encounter for general adult medical examination without abnormal findings: Secondary | ICD-10-CM | POA: Diagnosis not present

## 2013-07-22 DIAGNOSIS — F341 Dysthymic disorder: Secondary | ICD-10-CM | POA: Diagnosis not present

## 2013-07-22 DIAGNOSIS — I10 Essential (primary) hypertension: Secondary | ICD-10-CM | POA: Diagnosis not present

## 2013-07-22 DIAGNOSIS — E7800 Pure hypercholesterolemia, unspecified: Secondary | ICD-10-CM | POA: Diagnosis not present

## 2013-07-22 DIAGNOSIS — D259 Leiomyoma of uterus, unspecified: Secondary | ICD-10-CM | POA: Diagnosis not present

## 2013-07-22 DIAGNOSIS — R188 Other ascites: Secondary | ICD-10-CM | POA: Diagnosis not present

## 2013-07-23 ENCOUNTER — Other Ambulatory Visit: Payer: Self-pay | Admitting: Obstetrics and Gynecology

## 2013-07-23 DIAGNOSIS — R188 Other ascites: Secondary | ICD-10-CM

## 2013-07-31 ENCOUNTER — Ambulatory Visit
Admission: RE | Admit: 2013-07-31 | Discharge: 2013-07-31 | Disposition: A | Discharge status: Home / Self Care | Payer: Medicare Other | Source: Ambulatory Visit | Attending: Obstetrics and Gynecology | Admitting: Obstetrics and Gynecology

## 2013-07-31 DIAGNOSIS — R188 Other ascites: Secondary | ICD-10-CM

## 2013-07-31 DIAGNOSIS — D259 Leiomyoma of uterus, unspecified: Secondary | ICD-10-CM | POA: Diagnosis not present

## 2013-07-31 MED ORDER — GADOBENATE DIMEGLUMINE 529 MG/ML IV SOLN
16.0000 mL | Freq: Once | INTRAVENOUS | Status: AC | PRN
  Administered 2013-07-31: 16 mL via INTRAVENOUS

## 2013-08-04 DIAGNOSIS — H04129 Dry eye syndrome of unspecified lacrimal gland: Secondary | ICD-10-CM | POA: Diagnosis not present

## 2013-08-07 DIAGNOSIS — S335XXA Sprain of ligaments of lumbar spine, initial encounter: Secondary | ICD-10-CM | POA: Diagnosis not present

## 2013-08-07 DIAGNOSIS — G894 Chronic pain syndrome: Secondary | ICD-10-CM | POA: Diagnosis not present

## 2013-08-07 DIAGNOSIS — S139XXA Sprain of joints and ligaments of unspecified parts of neck, initial encounter: Secondary | ICD-10-CM | POA: Diagnosis not present

## 2013-08-07 DIAGNOSIS — Z79899 Other long term (current) drug therapy: Secondary | ICD-10-CM | POA: Diagnosis not present

## 2013-08-19 DIAGNOSIS — I10 Essential (primary) hypertension: Secondary | ICD-10-CM | POA: Diagnosis not present

## 2013-09-01 ENCOUNTER — Other Ambulatory Visit (HOSPITAL_COMMUNITY)
Admission: RE | Admit: 2013-09-01 | Discharge: 2013-09-01 | Disposition: A | Discharge status: Home / Self Care | Payer: Medicare Other | Source: Ambulatory Visit | Attending: Obstetrics and Gynecology | Admitting: Obstetrics and Gynecology

## 2013-09-01 ENCOUNTER — Other Ambulatory Visit: Payer: Self-pay | Admitting: Obstetrics and Gynecology

## 2013-09-01 DIAGNOSIS — Z01419 Encounter for gynecological examination (general) (routine) without abnormal findings: Secondary | ICD-10-CM | POA: Diagnosis not present

## 2013-09-01 DIAGNOSIS — Z1151 Encounter for screening for human papillomavirus (HPV): Secondary | ICD-10-CM | POA: Diagnosis not present

## 2013-09-01 DIAGNOSIS — R188 Other ascites: Secondary | ICD-10-CM | POA: Diagnosis not present

## 2013-09-01 DIAGNOSIS — Z124 Encounter for screening for malignant neoplasm of cervix: Secondary | ICD-10-CM | POA: Insufficient documentation

## 2013-09-04 ENCOUNTER — Other Ambulatory Visit: Payer: Self-pay | Admitting: Obstetrics and Gynecology

## 2013-09-04 DIAGNOSIS — Z9089 Acquired absence of other organs: Secondary | ICD-10-CM | POA: Diagnosis not present

## 2013-09-04 DIAGNOSIS — J309 Allergic rhinitis, unspecified: Secondary | ICD-10-CM | POA: Diagnosis not present

## 2013-09-04 DIAGNOSIS — R1909 Other intra-abdominal and pelvic swelling, mass and lump: Secondary | ICD-10-CM | POA: Diagnosis not present

## 2013-09-04 DIAGNOSIS — Z9851 Tubal ligation status: Secondary | ICD-10-CM | POA: Diagnosis not present

## 2013-09-04 DIAGNOSIS — Z888 Allergy status to other drugs, medicaments and biological substances status: Secondary | ICD-10-CM | POA: Diagnosis not present

## 2013-09-04 DIAGNOSIS — Z800 Family history of malignant neoplasm of digestive organs: Secondary | ICD-10-CM | POA: Diagnosis not present

## 2013-09-04 DIAGNOSIS — R188 Other ascites: Secondary | ICD-10-CM | POA: Diagnosis not present

## 2013-09-04 DIAGNOSIS — E785 Hyperlipidemia, unspecified: Secondary | ICD-10-CM | POA: Diagnosis not present

## 2013-09-04 DIAGNOSIS — Z880 Allergy status to penicillin: Secondary | ICD-10-CM | POA: Diagnosis not present

## 2013-09-04 DIAGNOSIS — M4800 Spinal stenosis, site unspecified: Secondary | ICD-10-CM | POA: Diagnosis not present

## 2013-09-04 DIAGNOSIS — Z881 Allergy status to other antibiotic agents status: Secondary | ICD-10-CM | POA: Diagnosis not present

## 2013-09-04 DIAGNOSIS — Z79899 Other long term (current) drug therapy: Secondary | ICD-10-CM | POA: Diagnosis not present

## 2013-09-04 DIAGNOSIS — R42 Dizziness and giddiness: Secondary | ICD-10-CM | POA: Diagnosis not present

## 2013-09-04 DIAGNOSIS — Z8042 Family history of malignant neoplasm of prostate: Secondary | ICD-10-CM | POA: Diagnosis not present

## 2013-09-04 DIAGNOSIS — J45909 Unspecified asthma, uncomplicated: Secondary | ICD-10-CM | POA: Diagnosis not present

## 2013-09-04 DIAGNOSIS — I10 Essential (primary) hypertension: Secondary | ICD-10-CM | POA: Diagnosis not present

## 2013-09-08 ENCOUNTER — Inpatient Hospital Stay: Admission: RE | Admit: 2013-09-08 | Payer: Medicare Other | Source: Ambulatory Visit

## 2013-09-09 ENCOUNTER — Ambulatory Visit
Admission: RE | Admit: 2013-09-09 | Discharge: 2013-09-09 | Disposition: A | Discharge status: Home / Self Care | Payer: Medicare Other | Source: Ambulatory Visit | Attending: Obstetrics and Gynecology | Admitting: Obstetrics and Gynecology

## 2013-09-09 DIAGNOSIS — R188 Other ascites: Secondary | ICD-10-CM | POA: Diagnosis not present

## 2013-09-09 MED ORDER — IOHEXOL 300 MG/ML  SOLN
100.0000 mL | Freq: Once | INTRAMUSCULAR | Status: AC | PRN
  Administered 2013-09-09: 100 mL via INTRAVENOUS

## 2013-09-25 DIAGNOSIS — Z880 Allergy status to penicillin: Secondary | ICD-10-CM | POA: Diagnosis not present

## 2013-09-25 DIAGNOSIS — Z8042 Family history of malignant neoplasm of prostate: Secondary | ICD-10-CM | POA: Diagnosis not present

## 2013-09-25 DIAGNOSIS — Z888 Allergy status to other drugs, medicaments and biological substances status: Secondary | ICD-10-CM | POA: Diagnosis not present

## 2013-09-25 DIAGNOSIS — Z800 Family history of malignant neoplasm of digestive organs: Secondary | ICD-10-CM | POA: Diagnosis not present

## 2013-09-25 DIAGNOSIS — Z79899 Other long term (current) drug therapy: Secondary | ICD-10-CM | POA: Diagnosis not present

## 2013-09-25 DIAGNOSIS — Z881 Allergy status to other antibiotic agents status: Secondary | ICD-10-CM | POA: Diagnosis not present

## 2013-09-25 DIAGNOSIS — I10 Essential (primary) hypertension: Secondary | ICD-10-CM | POA: Diagnosis not present

## 2013-09-25 DIAGNOSIS — E7800 Pure hypercholesterolemia, unspecified: Secondary | ICD-10-CM | POA: Diagnosis not present

## 2013-09-25 DIAGNOSIS — Z9851 Tubal ligation status: Secondary | ICD-10-CM | POA: Diagnosis not present

## 2013-09-25 DIAGNOSIS — R188 Other ascites: Secondary | ICD-10-CM | POA: Diagnosis not present

## 2013-09-25 DIAGNOSIS — Z9089 Acquired absence of other organs: Secondary | ICD-10-CM | POA: Diagnosis not present

## 2013-09-25 DIAGNOSIS — R42 Dizziness and giddiness: Secondary | ICD-10-CM | POA: Diagnosis not present

## 2013-09-25 DIAGNOSIS — J45909 Unspecified asthma, uncomplicated: Secondary | ICD-10-CM | POA: Diagnosis not present

## 2013-09-25 DIAGNOSIS — J309 Allergic rhinitis, unspecified: Secondary | ICD-10-CM | POA: Diagnosis not present

## 2013-09-25 DIAGNOSIS — M4800 Spinal stenosis, site unspecified: Secondary | ICD-10-CM | POA: Diagnosis not present

## 2013-10-09 DIAGNOSIS — S335XXA Sprain of ligaments of lumbar spine, initial encounter: Secondary | ICD-10-CM | POA: Diagnosis not present

## 2013-10-09 DIAGNOSIS — G894 Chronic pain syndrome: Secondary | ICD-10-CM | POA: Diagnosis not present

## 2013-10-09 DIAGNOSIS — S139XXA Sprain of joints and ligaments of unspecified parts of neck, initial encounter: Secondary | ICD-10-CM | POA: Diagnosis not present

## 2013-10-21 ENCOUNTER — Ambulatory Visit (INDEPENDENT_AMBULATORY_CARE_PROVIDER_SITE_OTHER): Payer: Medicare Other | Admitting: Neurology

## 2013-10-21 ENCOUNTER — Encounter (INDEPENDENT_AMBULATORY_CARE_PROVIDER_SITE_OTHER): Payer: Self-pay

## 2013-10-21 ENCOUNTER — Encounter: Payer: Self-pay | Admitting: Neurology

## 2013-10-21 VITALS — BP 127/88 | HR 72 | Ht 65.0 in | Wt 187.0 lb

## 2013-10-21 DIAGNOSIS — R209 Unspecified disturbances of skin sensation: Secondary | ICD-10-CM | POA: Diagnosis not present

## 2013-10-21 DIAGNOSIS — E663 Overweight: Secondary | ICD-10-CM | POA: Diagnosis not present

## 2013-10-21 DIAGNOSIS — M542 Cervicalgia: Secondary | ICD-10-CM

## 2013-10-21 DIAGNOSIS — R200 Anesthesia of skin: Secondary | ICD-10-CM

## 2013-10-21 DIAGNOSIS — I10 Essential (primary) hypertension: Secondary | ICD-10-CM
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
## 2013-10-27 DIAGNOSIS — M48061 Spinal stenosis, lumbar region without neurogenic claudication: Secondary | ICD-10-CM | POA: Diagnosis not present

## 2013-10-27 DIAGNOSIS — S335XXA Sprain of ligaments of lumbar spine, initial encounter: Secondary | ICD-10-CM | POA: Diagnosis not present

## 2013-11-04 DIAGNOSIS — M48061 Spinal stenosis, lumbar region without neurogenic claudication: Secondary | ICD-10-CM | POA: Diagnosis not present

## 2013-11-07 ENCOUNTER — Other Ambulatory Visit: Payer: Self-pay

## 2013-11-07 DIAGNOSIS — Z1231 Encounter for screening mammogram for malignant neoplasm of breast: Secondary | ICD-10-CM

## 2013-11-10 DIAGNOSIS — M48061 Spinal stenosis, lumbar region without neurogenic claudication: Secondary | ICD-10-CM | POA: Diagnosis not present

## 2013-11-27 DIAGNOSIS — Z79899 Other long term (current) drug therapy: Secondary | ICD-10-CM | POA: Diagnosis not present

## 2013-11-27 DIAGNOSIS — G894 Chronic pain syndrome: Secondary | ICD-10-CM | POA: Diagnosis not present

## 2013-11-27 DIAGNOSIS — S139XXA Sprain of joints and ligaments of unspecified parts of neck, initial encounter: Secondary | ICD-10-CM | POA: Diagnosis not present

## 2013-11-27 DIAGNOSIS — R188 Other ascites: Secondary | ICD-10-CM | POA: Diagnosis not present

## 2013-11-27 DIAGNOSIS — S335XXA Sprain of ligaments of lumbar spine, initial encounter: Secondary | ICD-10-CM | POA: Diagnosis not present

## 2013-12-03 ENCOUNTER — Ambulatory Visit: Payer: Medicare Other

## 2013-12-10 ENCOUNTER — Ambulatory Visit: Payer: Medicare Other | Admitting: Neurology

## 2013-12-18 DIAGNOSIS — R350 Frequency of micturition: Secondary | ICD-10-CM | POA: Diagnosis not present

## 2013-12-18 DIAGNOSIS — R7309 Other abnormal glucose: Secondary | ICD-10-CM | POA: Diagnosis not present

## 2013-12-23 ENCOUNTER — Encounter (INDEPENDENT_AMBULATORY_CARE_PROVIDER_SITE_OTHER): Payer: Self-pay

## 2013-12-23 ENCOUNTER — Ambulatory Visit
Admission: RE | Admit: 2013-12-23 | Discharge: 2013-12-23 | Disposition: A | Discharge status: Home / Self Care | Payer: Medicare Other | Source: Ambulatory Visit

## 2013-12-23 DIAGNOSIS — Z1231 Encounter for screening mammogram for malignant neoplasm of breast: Secondary | ICD-10-CM | POA: Diagnosis not present

## 2013-12-26 DIAGNOSIS — R7309 Other abnormal glucose: Secondary | ICD-10-CM | POA: Diagnosis not present

## 2013-12-26 DIAGNOSIS — I959 Hypotension, unspecified: Secondary | ICD-10-CM | POA: Diagnosis not present

## 2014-01-20 DIAGNOSIS — R7301 Impaired fasting glucose: Secondary | ICD-10-CM | POA: Diagnosis not present

## 2014-01-20 DIAGNOSIS — I10 Essential (primary) hypertension: Secondary | ICD-10-CM | POA: Diagnosis not present

## 2014-01-20 DIAGNOSIS — E7800 Pure hypercholesterolemia, unspecified: Secondary | ICD-10-CM | POA: Diagnosis not present

## 2014-01-22 DIAGNOSIS — L708 Other acne: Secondary | ICD-10-CM | POA: Diagnosis not present

## 2014-02-02 DIAGNOSIS — H16229 Keratoconjunctivitis sicca, not specified as Sjogren's, unspecified eye: Secondary | ICD-10-CM | POA: Diagnosis not present

## 2014-02-02 DIAGNOSIS — H16149 Punctate keratitis, unspecified eye: Secondary | ICD-10-CM | POA: Diagnosis not present

## 2014-02-02 DIAGNOSIS — H2589 Other age-related cataract: Secondary | ICD-10-CM | POA: Diagnosis not present

## 2014-03-03 DIAGNOSIS — R188 Other ascites: Secondary | ICD-10-CM | POA: Diagnosis not present

## 2014-04-22 DIAGNOSIS — F411 Generalized anxiety disorder: Secondary | ICD-10-CM | POA: Diagnosis not present

## 2014-04-22 DIAGNOSIS — I10 Essential (primary) hypertension: Secondary | ICD-10-CM | POA: Diagnosis not present

## 2014-04-22 DIAGNOSIS — E7800 Pure hypercholesterolemia, unspecified: Secondary | ICD-10-CM | POA: Diagnosis not present

## 2014-04-29 DIAGNOSIS — R17 Unspecified jaundice: Secondary | ICD-10-CM | POA: Diagnosis not present

## 2014-08-18 DIAGNOSIS — H538 Other visual disturbances: Secondary | ICD-10-CM | POA: Diagnosis not present

## 2014-08-18 DIAGNOSIS — H04123 Dry eye syndrome of bilateral lacrimal glands: Secondary | ICD-10-CM | POA: Diagnosis not present

## 2014-08-27 DIAGNOSIS — R188 Other ascites: Secondary | ICD-10-CM | POA: Diagnosis not present

## 2014-08-27 DIAGNOSIS — E780 Pure hypercholesterolemia: Secondary | ICD-10-CM | POA: Diagnosis not present

## 2014-08-27 DIAGNOSIS — Z1389 Encounter for screening for other disorder: Secondary | ICD-10-CM | POA: Diagnosis not present

## 2014-08-27 DIAGNOSIS — E119 Type 2 diabetes mellitus without complications: Secondary | ICD-10-CM | POA: Diagnosis not present

## 2014-08-27 DIAGNOSIS — Z0000 Encounter for general adult medical examination without abnormal findings: Secondary | ICD-10-CM | POA: Diagnosis not present

## 2014-08-27 DIAGNOSIS — I10 Essential (primary) hypertension: Secondary | ICD-10-CM | POA: Diagnosis not present

## 2014-08-27 DIAGNOSIS — F419 Anxiety disorder, unspecified: Secondary | ICD-10-CM | POA: Diagnosis not present

## 2014-08-27 DIAGNOSIS — J45909 Unspecified asthma, uncomplicated: Secondary | ICD-10-CM | POA: Diagnosis not present

## 2014-09-09 DIAGNOSIS — H25813 Combined forms of age-related cataract, bilateral: Secondary | ICD-10-CM | POA: Diagnosis not present

## 2014-09-09 DIAGNOSIS — H43813 Vitreous degeneration, bilateral: Secondary | ICD-10-CM | POA: Diagnosis not present

## 2014-09-09 DIAGNOSIS — H04123 Dry eye syndrome of bilateral lacrimal glands: Secondary | ICD-10-CM | POA: Diagnosis not present

## 2014-09-10 DIAGNOSIS — Z01419 Encounter for gynecological examination (general) (routine) without abnormal findings: Secondary | ICD-10-CM | POA: Diagnosis not present

## 2014-09-10 DIAGNOSIS — R188 Other ascites: Secondary | ICD-10-CM | POA: Diagnosis not present

## 2014-12-14 ENCOUNTER — Other Ambulatory Visit: Payer: Self-pay

## 2014-12-14 DIAGNOSIS — Z1231 Encounter for screening mammogram for malignant neoplasm of breast: Secondary | ICD-10-CM

## 2014-12-24 ENCOUNTER — Encounter: Payer: Self-pay | Admitting: Neurology

## 2014-12-24 ENCOUNTER — Ambulatory Visit (INDEPENDENT_AMBULATORY_CARE_PROVIDER_SITE_OTHER): Payer: Medicare Other | Admitting: Neurology

## 2014-12-24 ENCOUNTER — Telehealth: Payer: Self-pay | Admitting: Neurology

## 2014-12-24 VITALS — BP 148/92 | HR 76 | Ht 65.0 in | Wt 166.0 lb

## 2014-12-24 DIAGNOSIS — H9313 Tinnitus, bilateral: Secondary | ICD-10-CM | POA: Diagnosis not present

## 2014-12-24 DIAGNOSIS — R200 Anesthesia of skin: Secondary | ICD-10-CM

## 2014-12-24 DIAGNOSIS — G4700 Insomnia, unspecified: Secondary | ICD-10-CM | POA: Diagnosis not present

## 2014-12-24 MED ORDER — CLONAZEPAM 0.5 MG PO TABS: 0.5000 mg | ORAL_TABLET | Freq: Two times a day (BID) | ORAL | Status: DC | PRN

## 2014-12-24 NOTE — Telephone Encounter (Signed)
She is coming in today for an evaluation.

## 2014-12-24 NOTE — Telephone Encounter (Signed)
Patient called stating that she is having ringing of the ears and would like to get some advice to what it could be. Please call and advice # [phone]
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
## 2014-12-28 NOTE — Telephone Encounter (Signed)
-----   Message from Danford Bad sent at 12/28/2014  8:38 AM EDT ----- [facility] states the referral sent on this patient has to come from patients PCP

## 2014-12-28 NOTE — Telephone Encounter (Signed)
She is aware and will call PCP for referral.

## 2014-12-28 NOTE — Telephone Encounter (Signed)
Tiffany Shepherd, please check with patient, if she is continually bothered by her tinnitus, she should contact her primary care for refer to ENT specialist

## 2015-01-01 ENCOUNTER — Ambulatory Visit
Admission: RE | Admit: 2015-01-01 | Discharge: 2015-01-01 | Disposition: A | Discharge status: Home / Self Care | Payer: Medicare Other | Source: Ambulatory Visit

## 2015-01-01 DIAGNOSIS — Z1231 Encounter for screening mammogram for malignant neoplasm of breast: Secondary | ICD-10-CM

## 2015-01-13 DIAGNOSIS — M25511 Pain in right shoulder: Secondary | ICD-10-CM | POA: Diagnosis not present

## 2015-01-25 DIAGNOSIS — L700 Acne vulgaris: Secondary | ICD-10-CM | POA: Diagnosis not present

## 2015-01-26 ENCOUNTER — Ambulatory Visit: Payer: Medicare Other | Admitting: Nurse Practitioner

## 2015-02-15 DIAGNOSIS — R21 Rash and other nonspecific skin eruption: Secondary | ICD-10-CM | POA: Diagnosis not present

## 2015-02-16 DIAGNOSIS — L309 Dermatitis, unspecified: Secondary | ICD-10-CM | POA: Diagnosis not present

## 2015-02-17 DIAGNOSIS — H8111 Benign paroxysmal vertigo, right ear: Secondary | ICD-10-CM | POA: Diagnosis not present

## 2015-02-17 DIAGNOSIS — H9313 Tinnitus, bilateral: Secondary | ICD-10-CM | POA: Diagnosis not present

## 2015-02-17 DIAGNOSIS — H833X3 Noise effects on inner ear, bilateral: Secondary | ICD-10-CM | POA: Diagnosis not present

## 2015-02-17 DIAGNOSIS — H903 Sensorineural hearing loss, bilateral: Secondary | ICD-10-CM | POA: Diagnosis not present

## 2015-02-24 ENCOUNTER — Ambulatory Visit (INDEPENDENT_AMBULATORY_CARE_PROVIDER_SITE_OTHER): Payer: Medicare Other | Admitting: Nurse Practitioner

## 2015-02-24 ENCOUNTER — Encounter: Payer: Self-pay | Admitting: Nurse Practitioner

## 2015-02-24 VITALS — BP 126/85 | HR 104 | Ht 65.0 in | Wt 161.5 lb

## 2015-02-24 DIAGNOSIS — H9313 Tinnitus, bilateral: Secondary | ICD-10-CM | POA: Diagnosis not present

## 2015-02-24 DIAGNOSIS — M542 Cervicalgia: Secondary | ICD-10-CM

## 2015-02-24 DIAGNOSIS — H9319 Tinnitus, unspecified ear: Secondary | ICD-10-CM | POA: Insufficient documentation

## 2015-02-24 MED ORDER — CLONAZEPAM 0.5 MG PO TABS: ORAL_TABLET | ORAL | Status: DC
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
## 2015-02-24 NOTE — Progress Notes (Signed)
I have reviewed and agreed above plan. 

## 2015-02-24 NOTE — Patient Instructions (Signed)
Klonopin one half to one tablet at night May do vestibular rehabilitation exercises given information on tinnitus Follow-up in 6-8 month

## 2015-02-25 DIAGNOSIS — I10 Essential (primary) hypertension: Secondary | ICD-10-CM | POA: Diagnosis not present

## 2015-02-25 DIAGNOSIS — F419 Anxiety disorder, unspecified: Secondary | ICD-10-CM | POA: Diagnosis not present

## 2015-02-25 DIAGNOSIS — R7301 Impaired fasting glucose: Secondary | ICD-10-CM | POA: Diagnosis not present

## 2015-02-25 DIAGNOSIS — E780 Pure hypercholesterolemia: Secondary | ICD-10-CM | POA: Diagnosis not present

## 2015-03-09 DIAGNOSIS — L309 Dermatitis, unspecified: Secondary | ICD-10-CM | POA: Diagnosis not present

## 2015-03-10 DIAGNOSIS — H01023 Squamous blepharitis right eye, unspecified eyelid: Secondary | ICD-10-CM | POA: Diagnosis not present

## 2015-04-21 DIAGNOSIS — H1131 Conjunctival hemorrhage, right eye: Secondary | ICD-10-CM | POA: Diagnosis not present

## 2015-05-03 ENCOUNTER — Telehealth: Payer: Self-pay | Admitting: Neurology

## 2015-05-03 NOTE — Telephone Encounter (Signed)
Spoke to patient - she woke up with left-sided facial numbness. She denied any facial drooping, weakness, blurred or double vision, headache, slurred speech, loss of balance or confusion.  Offered her an appt with the NP today but she declined and took an available appt with Dr. Krista Blue the following day.  Says this is the same symptom she has had in the past.  Instructed her to proceed to the ED immediately if she starts to notice any of the symptoms above and to call us back with any questions.

## 2015-05-03 NOTE — Telephone Encounter (Signed)
Patient is calling and states that she woke up this morning and had a numbness on the left side of her face which has not gone away. Please call to discuss.

## 2015-05-04 ENCOUNTER — Ambulatory Visit (INDEPENDENT_AMBULATORY_CARE_PROVIDER_SITE_OTHER): Payer: Medicare Other | Admitting: Neurology

## 2015-05-04 ENCOUNTER — Encounter: Payer: Self-pay | Admitting: Neurology

## 2015-05-04 VITALS — BP 130/89 | HR 81 | Ht 65.0 in | Wt 166.0 lb

## 2015-05-04 DIAGNOSIS — R200 Anesthesia of skin: Secondary | ICD-10-CM

## 2015-05-04 DIAGNOSIS — H9313 Tinnitus, bilateral: Secondary | ICD-10-CM
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
## 2015-05-05 ENCOUNTER — Telehealth: Payer: Self-pay | Admitting: *Deleted

## 2015-05-05 LAB — ANA W/REFLEX IF POSITIVE: Anti Nuclear Antibody(ANA): NEGATIVE

## 2015-05-05 LAB — TSH: TSH: 2.97 u[IU]/mL (ref 0.450–4.500)

## 2015-05-05 LAB — SEDIMENTATION RATE: Sed Rate: 4 mm/hr (ref 0–40)

## 2015-05-05 LAB — CK: Total CK: 201 U/L — ABNORMAL HIGH (ref 24–173)

## 2015-05-05 LAB — C-REACTIVE PROTEIN: CRP: <0.6 mg/L (ref 0.0–4.9)

## 2015-05-05 NOTE — Telephone Encounter (Signed)
Patient aware of normal results

## 2015-05-05 NOTE — Telephone Encounter (Signed)
-----   Message from Marcial Pacas, MD sent at 05/05/2015  5:37 PM EDT ----- Please call patient for no significant abnormalities.

## 2015-05-11 ENCOUNTER — Inpatient Hospital Stay: Admission: RE | Admit: 2015-05-11 | Payer: Medicare Other | Source: Ambulatory Visit

## 2015-05-25 ENCOUNTER — Inpatient Hospital Stay: Admission: RE | Admit: 2015-05-25 | Payer: Medicare Other | Source: Ambulatory Visit

## 2015-06-02 ENCOUNTER — Ambulatory Visit: Payer: Medicare Other | Admitting: Neurology

## 2015-08-26 ENCOUNTER — Ambulatory Visit: Payer: Medicare Other | Admitting: Nurse Practitioner

## 2015-09-10 DIAGNOSIS — H01029 Squamous blepharitis unspecified eye, unspecified eyelid: Secondary | ICD-10-CM | POA: Diagnosis not present

## 2015-09-10 DIAGNOSIS — H25813 Combined forms of age-related cataract, bilateral: Secondary | ICD-10-CM | POA: Diagnosis not present

## 2015-09-10 DIAGNOSIS — H04123 Dry eye syndrome of bilateral lacrimal glands: Secondary | ICD-10-CM | POA: Diagnosis not present

## 2015-10-11 ENCOUNTER — Other Ambulatory Visit (HOSPITAL_COMMUNITY)
Admission: RE | Admit: 2015-10-11 | Discharge: 2015-10-11 | Disposition: A | Discharge status: Home / Self Care | Payer: Medicare Other | Source: Ambulatory Visit | Attending: Obstetrics and Gynecology | Admitting: Obstetrics and Gynecology

## 2015-10-11 ENCOUNTER — Other Ambulatory Visit: Payer: Self-pay | Admitting: Obstetrics and Gynecology

## 2015-10-11 DIAGNOSIS — Z124 Encounter for screening for malignant neoplasm of cervix: Secondary | ICD-10-CM | POA: Insufficient documentation

## 2015-10-12 LAB — CYTOLOGY - PAP

## 2015-11-01 ENCOUNTER — Ambulatory Visit (INDEPENDENT_AMBULATORY_CARE_PROVIDER_SITE_OTHER): Payer: Medicare Other | Admitting: Neurology

## 2015-11-01 ENCOUNTER — Encounter: Payer: Self-pay | Admitting: Neurology

## 2015-11-01 VITALS — BP 138/86 | HR 83 | Ht 65.0 in | Wt 177.5 lb

## 2015-11-01 DIAGNOSIS — R200 Anesthesia of skin: Secondary | ICD-10-CM
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
## 2015-11-01 NOTE — Addendum Note (Signed)
Addended by: Marcial Pacas on: 11/01/2015 12:10 PM   Modules accepted: Orders

## 2015-12-30 ENCOUNTER — Other Ambulatory Visit: Payer: Self-pay

## 2015-12-30 DIAGNOSIS — Z139 Encounter for screening, unspecified: Secondary | ICD-10-CM

## 2016-01-13 ENCOUNTER — Ambulatory Visit
Admission: RE | Admit: 2016-01-13 | Discharge: 2016-01-13 | Disposition: A | Discharge status: Home / Self Care | Payer: Medicare Other | Source: Ambulatory Visit

## 2016-01-13 DIAGNOSIS — Z139 Encounter for screening, unspecified: Secondary | ICD-10-CM

## 2016-09-12 DIAGNOSIS — Z6832 Body mass index (BMI) 32.0-32.9, adult: Secondary | ICD-10-CM | POA: Insufficient documentation

## 2016-09-12 DIAGNOSIS — E669 Obesity, unspecified: Secondary | ICD-10-CM | POA: Insufficient documentation

## 2016-10-11 ENCOUNTER — Other Ambulatory Visit (HOSPITAL_COMMUNITY)
Admission: RE | Admit: 2016-10-11 | Discharge: 2016-10-11 | Disposition: A | Discharge status: Home / Self Care | Payer: Medicare Other | Source: Ambulatory Visit | Attending: Obstetrics and Gynecology | Admitting: Obstetrics and Gynecology

## 2016-10-11 ENCOUNTER — Other Ambulatory Visit: Payer: Self-pay | Admitting: Obstetrics and Gynecology

## 2016-10-11 DIAGNOSIS — Z1151 Encounter for screening for human papillomavirus (HPV): Secondary | ICD-10-CM | POA: Diagnosis present

## 2016-10-11 DIAGNOSIS — Z01419 Encounter for gynecological examination (general) (routine) without abnormal findings: Secondary | ICD-10-CM | POA: Insufficient documentation

## 2016-10-17 LAB — CYTOLOGY - PAP
DIAGNOSIS: NEGATIVE
HPV (WINDOPATH): NOT DETECTED

## 2016-12-01 ENCOUNTER — Encounter: Payer: Self-pay | Admitting: Podiatry

## 2016-12-01 ENCOUNTER — Ambulatory Visit (INDEPENDENT_AMBULATORY_CARE_PROVIDER_SITE_OTHER): Payer: Medicare Other | Admitting: Podiatry

## 2016-12-01 ENCOUNTER — Ambulatory Visit (INDEPENDENT_AMBULATORY_CARE_PROVIDER_SITE_OTHER): Payer: Medicare Other

## 2016-12-01 DIAGNOSIS — M898X9 Other specified disorders of bone, unspecified site: Secondary | ICD-10-CM

## 2016-12-01 DIAGNOSIS — M792 Neuralgia and neuritis, unspecified: Secondary | ICD-10-CM

## 2016-12-01 DIAGNOSIS — R52 Pain, unspecified: Secondary | ICD-10-CM

## 2016-12-04 ENCOUNTER — Telehealth: Payer: Self-pay | Admitting: *Deleted

## 2016-12-04 MED ORDER — NONFORMULARY OR COMPOUNDED ITEM: 2 refills | Status: AC

## 2016-12-04 NOTE — Telephone Encounter (Signed)
Dr. Jacqualyn Posey ordered Antiinflammatory Cream from [facility]. Orders faxed to [facility].

## 2016-12-05 ENCOUNTER — Telehealth: Payer: Self-pay | Admitting: Podiatry

## 2016-12-05 NOTE — Telephone Encounter (Signed)
Pt got a call from [employer] and insurance will not cover medication is there something else you could give her.

## 2016-12-06 MED ORDER — DICLOFENAC SODIUM 1 % TD GEL: 2.0000 g | Freq: Four times a day (QID) | TRANSDERMAL | 2 refills | Status: AC

## 2016-12-06 NOTE — Telephone Encounter (Signed)
Can try voltaren gel

## 2016-12-06 NOTE — Telephone Encounter (Signed)
Pt states she is at [employer] on [address] and forgot the shoes Dr. Jacqualyn Posey wanted her to get. 12/06/2016-Left message informing pt that Dr. Jacqualyn Posey recommended New Balance running shoes in the higher number styles, Asics, and [employer] athletic shoes. I told pt that I had received her message concerning her insurance not covering the [employer] Antiinflammatory cream and that Dr. Jacqualyn Posey ordered Voltaren Gel 1%, and there would be a delay due to her insurance would require Prior Approval for this medication. I told pt that [facility] offered an Authorized pain substitute for pt's self-pay that Dr. Jacqualyn Posey recommended if she did not want to wait for the PA of the Voltaren gel !%.

## 2016-12-07 ENCOUNTER — Other Ambulatory Visit: Payer: Self-pay | Admitting: Obstetrics and Gynecology

## 2016-12-07 DIAGNOSIS — Z1231 Encounter for screening mammogram for malignant neoplasm of breast: Secondary | ICD-10-CM

## 2016-12-07 NOTE — Progress Notes (Signed)
Subjective:     Patient ID: Tiffany Shepherd, female   DOB: 1950-10-02, 66 y.o.   MRN: [id number]  HPI 66 year old female presents the office today for intermittent right foot pain. She feels that there is a sharp pain going to her big toe. She states that this only happened and she wears a certain pair of tennis shoes. She persists issues of the shoe market. She went back to purchase a second pair and a cross pressure. She feels that when she ties her shoes this is when she starts to get the symptoms shooting to her big toe. She denies any recent injury or trauma. She is able to wear any other shoe without any problems he she has no pain today.  Review of Systems  All other systems reviewed and are negative.      Objective:   Physical Exam General: AAO x3, NAD  Dermatological: Skin is warm, dry and supple bilateral. Nails x 10 are well manicured; remaining integument appears unremarkable at this time. There are no open sores, no preulcerative lesions, no rash or signs of infection present.  Vascular: Dorsalis Pedis artery and Posterior Tibial artery pedal pulses are 2/4 bilateral with immedate capillary fill time. There is no pain with calf compression, swelling, warmth, erythema.   Neruologic: Grossly intact via light touch bilateral. Vibratory intact via tuning fork bilateral. Protective threshold with Semmes Wienstein monofilament intact to all pedal sites bilateral. Negative Tinel sign.  Musculoskeletal:There is no area of tenderness bilateral lower extremities. There is no overlying edema, erythema, increase in warmth. MMT 5/5. There doesn't be a prominence on the dorsal aspect of the midfoot on the right foot consistent with a bony exostosis. Range of motion intact.  Gait: Unassisted, Nonantalgic.      Assessment:     66 year old female with neuritis symptoms likely from shoe gear    Plan:     -Treatment options discussed including all alternatives, risks, and  complications -Etiology of symptoms were discussed -X-rays were obtained and reviewed with the patient. No evidence of acute fractures identified. Small prominence on the dorsal aspect of the midfoot on the right side. -Discussed changing shoes. She can try releasing that she's take pressure. I believe the shoes causing pressure on the bony exostosis resulting in superficial peroneal nerve neuritis. She has no problems she is wearing the shoes. -I did prescribe topical anti-inflammatory that she can use as needed. -Follow up with symptoms not resolve the next couple weeks or sooner if needed.  Celesta Gentile, DPM

## 2016-12-22 NOTE — Telephone Encounter (Addendum)
[facility] denied the Voltaren Gel 1% due to not approved by the FDA for bony exostosis or neuritis. Left message offering to reorder the [employer] cream.

## 2016-12-25 ENCOUNTER — Telehealth: Payer: Self-pay | Admitting: *Deleted

## 2016-12-25 ENCOUNTER — Ambulatory Visit: Payer: Medicaid Other | Admitting: Neurology

## 2016-12-25 NOTE — Telephone Encounter (Signed)
No show - called answering service at 7:21am to cancel 11am follow up appt.

## 2016-12-26 ENCOUNTER — Encounter: Payer: Self-pay | Admitting: Neurology

## 2017-01-16 ENCOUNTER — Ambulatory Visit
Admission: RE | Admit: 2017-01-16 | Discharge: 2017-01-16 | Disposition: A | Discharge status: Home / Self Care | Payer: Medicare Other | Source: Ambulatory Visit | Attending: Obstetrics and Gynecology | Admitting: Obstetrics and Gynecology

## 2017-01-16 DIAGNOSIS — Z1231 Encounter for screening mammogram for malignant neoplasm of breast: Secondary | ICD-10-CM

## 2017-04-18 ENCOUNTER — Telehealth: Payer: Self-pay | Admitting: *Deleted

## 2017-04-18 NOTE — Telephone Encounter (Signed)
Pt states she was seen a few months ago and has questions. 04/18/2017-Left message informing pt that at her last visit, Dr. Jacqualyn Posey wanted her to come back in 2 weeks and if she had questions or problems at this time she would benefit from an appt, to call [phone].

## 2017-05-04 ENCOUNTER — Ambulatory Visit: Payer: Medicare Other | Admitting: Podiatry

## 2017-05-14 ENCOUNTER — Encounter: Payer: Self-pay | Admitting: Podiatry

## 2017-05-14 ENCOUNTER — Ambulatory Visit (INDEPENDENT_AMBULATORY_CARE_PROVIDER_SITE_OTHER): Payer: Medicare Other | Admitting: Podiatry

## 2017-05-14 DIAGNOSIS — M722 Plantar fascial fibromatosis: Secondary | ICD-10-CM

## 2017-05-14 DIAGNOSIS — M79673 Pain in unspecified foot: Secondary | ICD-10-CM

## 2017-05-16 NOTE — Progress Notes (Signed)
Subjective: 66 year old female presents the office today for concerns and continued pain in her right foot. She states that she still gets pain in the arch of her foot mostly. She states that she has no pain when she wears a high-heeled shoe. She has gone to the good feet store and she has tried numerous over-the-counter inserts as well as shoes and this has not been helping this is actually been hurting her foot. She denies any increase in swelling or redness to her feet denies any numbness or tingling at this time. Denies any systemic complaints such as fevers, chills, nausea, vomiting. No acute changes since last appointment, and no other complaints at this time.   Objective: AAO x3, NAD DP/PT pulses palpable bilaterally, CRT less than 3 seconds There is no area pinpoint bony tenderness or pain the vibratory sensation bilaterally. Subjective there is tenderness on medial band of the plantar fascial in the arch of the foot on the plantar fascia however she is not expanding any pain today. She states that her foot has been hurting quite a bit but over the last couple days last been feeling somewhat better. Plantar fascia. Be intact. Achilles tendon intact. Over the area tenderness of the midfoot this time. MMT 5/5. Range of motion intact. Equinus is present. No open lesions or pre-ulcerative lesions.  Negative tinel sign No pain with calf compression, swelling, warmth, erythema  Assessment: Chronic arch pain, plantar fasciitis  Plan: -All treatment options discussed with the patient including all alternatives, risks, complications.  -At this point discussed a couple treatment different treatment options for her. Ultimately I think that when she is not wearing a high-heeled shoe need to get her into a good custom molded orthotic and she would like to proceed with this. I had Liliane Channel evaluate her today as well and he did measure her for orthotics today she wishes to proceed with them. Once we get a good  custom orthotic that think she'll benefit from physical therapy however we'll hold off on that until he can actually get her into a tennis shoe. I want her to continue with stretching, icing exercises at home as well as well as anti-inflammatories as needed. -RTC in 3 weeks to PUO then follow back up with me.  -Patient encouraged to call the office with any questions, concerns, change in symptoms.   Celesta Gentile, DPM

## 2017-06-04 ENCOUNTER — Ambulatory Visit: Payer: Medicare Other | Admitting: Orthotics

## 2017-06-04 DIAGNOSIS — M722 Plantar fascial fibromatosis: Secondary | ICD-10-CM

## 2017-06-04 NOTE — Progress Notes (Signed)
Patient p/u custom made foot orthoses.  She was very pleased with outcome.  Even though she was advised Tiffany Shepherd does not typically pay for F/O, she wanted [place] to bill them anyway and she signed ABN.

## 2017-07-25 ENCOUNTER — Telehealth: Payer: Self-pay | Admitting: Podiatry

## 2017-07-25 NOTE — Telephone Encounter (Signed)
Pt called and is concerned that she is getting a bill for the orthotics and is being told by insurance company they paid it.Billng dept had already told pt they ahad not paid for it. Pt is concerned and said she has spoken to her insurance company 3 times and they are telling her they are covered. I gave pt the procedure code we billed and the dx code filed and pt is going to contact [employer]. I told pt to get an authorization number when she calls them. I also gave pt our fax number for them to fax [place] the information. Also explained to pt if they do not get covered we can set up a payment plan.

## 2017-09-18 ENCOUNTER — Telehealth: Payer: Self-pay | Admitting: Podiatry

## 2017-09-18 NOTE — Telephone Encounter (Signed)
Pt needs a letter to give to [facility] since she is not able to wear the orthotics she got from there.She states you have given her one previously but it was stamped and not signed. Can we please redo this letter and sign it and I can mail to patient.

## 2017-09-18 NOTE — Telephone Encounter (Signed)
That is OK to write a letter.

## 2017-12-28 ENCOUNTER — Other Ambulatory Visit: Payer: Self-pay | Admitting: Obstetrics and Gynecology

## 2017-12-28 DIAGNOSIS — Z1231 Encounter for screening mammogram for malignant neoplasm of breast: Secondary | ICD-10-CM

## 2018-01-15 ENCOUNTER — Telehealth: Payer: Self-pay | Admitting: *Deleted

## 2018-01-15 NOTE — Telephone Encounter (Signed)
Pt left name and phone number. 

## 2018-01-15 NOTE — Telephone Encounter (Signed)
Left message for pt to call with question or concern and often I can return the call with an answer.

## 2018-01-29 ENCOUNTER — Ambulatory Visit
Admission: RE | Admit: 2018-01-29 | Discharge: 2018-01-29 | Disposition: A | Discharge status: Home / Self Care | Payer: Medicare Other | Source: Ambulatory Visit | Attending: Obstetrics and Gynecology | Admitting: Obstetrics and Gynecology

## 2018-01-29 DIAGNOSIS — Z1231 Encounter for screening mammogram for malignant neoplasm of breast: Secondary | ICD-10-CM

## 2018-11-01 ENCOUNTER — Other Ambulatory Visit: Payer: Self-pay | Admitting: Internal Medicine

## 2018-11-01 DIAGNOSIS — E2839 Other primary ovarian failure: Secondary | ICD-10-CM

## 2018-11-01 DIAGNOSIS — Z1231 Encounter for screening mammogram for malignant neoplasm of breast: Secondary | ICD-10-CM

## 2019-02-05 ENCOUNTER — Other Ambulatory Visit: Payer: Medicare Other

## 2019-02-05 ENCOUNTER — Ambulatory Visit: Payer: Medicare Other

## 2019-03-12 ENCOUNTER — Ambulatory Visit: Payer: Medicare Other

## 2019-04-25 ENCOUNTER — Other Ambulatory Visit: Payer: Self-pay

## 2019-04-25 ENCOUNTER — Ambulatory Visit
Admission: RE | Admit: 2019-04-25 | Discharge: 2019-04-25 | Disposition: A | Discharge status: Home / Self Care | Payer: Medicare Other | Source: Ambulatory Visit | Attending: Internal Medicine | Admitting: Internal Medicine

## 2019-04-25 DIAGNOSIS — Z1231 Encounter for screening mammogram for malignant neoplasm of breast: Secondary | ICD-10-CM

## 2020-04-05 ENCOUNTER — Other Ambulatory Visit: Payer: Self-pay | Admitting: Internal Medicine

## 2020-04-05 DIAGNOSIS — Z1231 Encounter for screening mammogram for malignant neoplasm of breast: Secondary | ICD-10-CM

## 2020-04-29 ENCOUNTER — Other Ambulatory Visit: Payer: Self-pay

## 2020-04-29 ENCOUNTER — Ambulatory Visit
Admission: RE | Admit: 2020-04-29 | Discharge: 2020-04-29 | Disposition: A | Discharge status: Home / Self Care | Payer: Medicare Other | Source: Ambulatory Visit | Attending: Internal Medicine | Admitting: Internal Medicine

## 2020-04-29 ENCOUNTER — Ambulatory Visit: Payer: Medicare Other

## 2020-04-29 DIAGNOSIS — Z1231 Encounter for screening mammogram for malignant neoplasm of breast: Secondary | ICD-10-CM

## 2020-08-26 DIAGNOSIS — M25562 Pain in left knee: Secondary | ICD-10-CM | POA: Diagnosis not present

## 2020-09-26 DIAGNOSIS — M25562 Pain in left knee: Secondary | ICD-10-CM | POA: Diagnosis not present

## 2020-09-29 DIAGNOSIS — E7800 Pure hypercholesterolemia, unspecified: Secondary | ICD-10-CM | POA: Diagnosis not present

## 2020-09-29 DIAGNOSIS — E1169 Type 2 diabetes mellitus with other specified complication: Secondary | ICD-10-CM | POA: Diagnosis not present

## 2020-09-29 DIAGNOSIS — I10 Essential (primary) hypertension: Secondary | ICD-10-CM | POA: Diagnosis not present

## 2020-09-30 DIAGNOSIS — H40023 Open angle with borderline findings, high risk, bilateral: Secondary | ICD-10-CM | POA: Diagnosis not present

## 2020-10-24 DIAGNOSIS — M25562 Pain in left knee: Secondary | ICD-10-CM | POA: Diagnosis not present

## 2020-11-24 DIAGNOSIS — M25562 Pain in left knee: Secondary | ICD-10-CM | POA: Diagnosis not present

## 2020-12-24 DIAGNOSIS — M25562 Pain in left knee: Secondary | ICD-10-CM | POA: Diagnosis not present

## 2021-01-24 DIAGNOSIS — M25562 Pain in left knee: Secondary | ICD-10-CM | POA: Diagnosis not present

## 2021-01-31 DIAGNOSIS — I10 Essential (primary) hypertension: Secondary | ICD-10-CM | POA: Diagnosis not present

## 2021-01-31 DIAGNOSIS — Z79899 Other long term (current) drug therapy: Secondary | ICD-10-CM | POA: Diagnosis not present

## 2021-01-31 DIAGNOSIS — E1169 Type 2 diabetes mellitus with other specified complication: Secondary | ICD-10-CM | POA: Diagnosis not present

## 2021-01-31 DIAGNOSIS — E119 Type 2 diabetes mellitus without complications: Secondary | ICD-10-CM | POA: Diagnosis not present

## 2021-01-31 DIAGNOSIS — E785 Hyperlipidemia, unspecified: Secondary | ICD-10-CM | POA: Diagnosis not present

## 2021-02-23 DIAGNOSIS — M25562 Pain in left knee: Secondary | ICD-10-CM | POA: Diagnosis not present

## 2021-03-26 DIAGNOSIS — M25562 Pain in left knee: Secondary | ICD-10-CM | POA: Diagnosis not present

## 2021-03-29 DIAGNOSIS — B029 Zoster without complications: Secondary | ICD-10-CM | POA: Diagnosis not present

## 2021-03-29 DIAGNOSIS — R202 Paresthesia of skin: Secondary | ICD-10-CM | POA: Diagnosis not present

## 2021-04-13 LAB — GLUCOSE, POCT (MANUAL RESULT ENTRY): POC Glucose: 94 mg/dl (ref 70–99)

## 2021-04-14 DIAGNOSIS — H25813 Combined forms of age-related cataract, bilateral: Secondary | ICD-10-CM | POA: Diagnosis not present

## 2021-04-14 DIAGNOSIS — H40023 Open angle with borderline findings, high risk, bilateral: Secondary | ICD-10-CM | POA: Diagnosis not present

## 2021-04-15 DIAGNOSIS — Z0000 Encounter for general adult medical examination without abnormal findings: Secondary | ICD-10-CM | POA: Diagnosis not present

## 2021-04-15 DIAGNOSIS — I10 Essential (primary) hypertension: Secondary | ICD-10-CM | POA: Diagnosis not present

## 2021-04-15 DIAGNOSIS — E7800 Pure hypercholesterolemia, unspecified: Secondary | ICD-10-CM | POA: Diagnosis not present

## 2021-04-15 DIAGNOSIS — E1169 Type 2 diabetes mellitus with other specified complication: Secondary | ICD-10-CM | POA: Diagnosis not present

## 2021-04-15 DIAGNOSIS — N1831 Chronic kidney disease, stage 3a: Secondary | ICD-10-CM | POA: Diagnosis not present

## 2021-04-19 ENCOUNTER — Other Ambulatory Visit: Payer: Self-pay | Admitting: Internal Medicine

## 2021-04-19 DIAGNOSIS — Z1231 Encounter for screening mammogram for malignant neoplasm of breast: Secondary | ICD-10-CM

## 2021-04-26 DIAGNOSIS — M25562 Pain in left knee: Secondary | ICD-10-CM | POA: Diagnosis not present

## 2021-04-26 DIAGNOSIS — M25511 Pain in right shoulder: Secondary | ICD-10-CM | POA: Diagnosis not present

## 2021-05-03 DIAGNOSIS — R17 Unspecified jaundice: Secondary | ICD-10-CM | POA: Diagnosis not present

## 2021-05-26 DIAGNOSIS — M25562 Pain in left knee: Secondary | ICD-10-CM | POA: Diagnosis not present

## 2021-05-30 ENCOUNTER — Ambulatory Visit: Payer: Medicare Other

## 2021-06-21 ENCOUNTER — Ambulatory Visit: Payer: Medicare Other

## 2021-06-21 ENCOUNTER — Ambulatory Visit
Admission: RE | Admit: 2021-06-21 | Discharge: 2021-06-21 | Disposition: A | Discharge status: Home / Self Care | Payer: Medicare Other | Source: Ambulatory Visit | Attending: Internal Medicine | Admitting: Internal Medicine

## 2021-06-21 ENCOUNTER — Other Ambulatory Visit: Payer: Self-pay

## 2021-06-21 DIAGNOSIS — Z1231 Encounter for screening mammogram for malignant neoplasm of breast: Secondary | ICD-10-CM | POA: Diagnosis not present

## 2021-06-26 DIAGNOSIS — M25562 Pain in left knee: Secondary | ICD-10-CM | POA: Diagnosis not present

## 2021-07-11 DIAGNOSIS — M5416 Radiculopathy, lumbar region: Secondary | ICD-10-CM | POA: Diagnosis not present

## 2021-07-11 DIAGNOSIS — M25551 Pain in right hip: Secondary | ICD-10-CM | POA: Diagnosis not present

## 2021-07-11 DIAGNOSIS — M5459 Other low back pain: Secondary | ICD-10-CM | POA: Diagnosis not present

## 2021-07-24 DIAGNOSIS — M5459 Other low back pain: Secondary | ICD-10-CM | POA: Diagnosis not present

## 2021-07-30 DIAGNOSIS — M48062 Spinal stenosis, lumbar region with neurogenic claudication: Secondary | ICD-10-CM | POA: Diagnosis not present

## 2021-08-26 DIAGNOSIS — M25562 Pain in left knee: Secondary | ICD-10-CM | POA: Diagnosis not present

## 2021-08-31 DIAGNOSIS — M25551 Pain in right hip: Secondary | ICD-10-CM | POA: Diagnosis not present

## 2021-08-31 DIAGNOSIS — M1611 Unilateral primary osteoarthritis, right hip: Secondary | ICD-10-CM | POA: Diagnosis not present

## 2021-09-06 DIAGNOSIS — M5416 Radiculopathy, lumbar region: Secondary | ICD-10-CM | POA: Diagnosis not present

## 2021-09-06 DIAGNOSIS — M4316 Spondylolisthesis, lumbar region: Secondary | ICD-10-CM | POA: Diagnosis not present

## 2021-09-26 DIAGNOSIS — M25562 Pain in left knee: Secondary | ICD-10-CM | POA: Diagnosis not present

## 2021-09-29 DIAGNOSIS — M5451 Vertebrogenic low back pain: Secondary | ICD-10-CM | POA: Diagnosis not present

## 2021-09-29 DIAGNOSIS — M5416 Radiculopathy, lumbar region: Secondary | ICD-10-CM | POA: Diagnosis not present

## 2021-10-06 DIAGNOSIS — M5416 Radiculopathy, lumbar region: Secondary | ICD-10-CM | POA: Diagnosis not present

## 2021-10-06 DIAGNOSIS — M5451 Vertebrogenic low back pain: Secondary | ICD-10-CM | POA: Diagnosis not present

## 2021-10-11 DIAGNOSIS — M5416 Radiculopathy, lumbar region: Secondary | ICD-10-CM | POA: Diagnosis not present

## 2021-10-11 DIAGNOSIS — M5451 Vertebrogenic low back pain: Secondary | ICD-10-CM | POA: Diagnosis not present

## 2021-10-13 DIAGNOSIS — M5416 Radiculopathy, lumbar region: Secondary | ICD-10-CM | POA: Diagnosis not present

## 2021-10-13 DIAGNOSIS — M5451 Vertebrogenic low back pain: Secondary | ICD-10-CM | POA: Diagnosis not present

## 2021-10-18 ENCOUNTER — Other Ambulatory Visit: Payer: Self-pay | Admitting: Neurological Surgery

## 2021-10-18 DIAGNOSIS — R1031 Right lower quadrant pain: Secondary | ICD-10-CM | POA: Diagnosis not present

## 2021-10-24 DIAGNOSIS — M25562 Pain in left knee: Secondary | ICD-10-CM | POA: Diagnosis not present

## 2021-10-25 DIAGNOSIS — M5451 Vertebrogenic low back pain: Secondary | ICD-10-CM | POA: Diagnosis not present

## 2021-10-25 DIAGNOSIS — M5416 Radiculopathy, lumbar region: Secondary | ICD-10-CM | POA: Diagnosis not present

## 2021-11-02 ENCOUNTER — Ambulatory Visit
Admission: RE | Admit: 2021-11-02 | Discharge: 2021-11-02 | Disposition: A | Discharge status: Home / Self Care | Payer: Medicare Other | Source: Ambulatory Visit | Attending: Neurological Surgery | Admitting: Neurological Surgery

## 2021-11-02 DIAGNOSIS — M25551 Pain in right hip: Secondary | ICD-10-CM | POA: Diagnosis not present

## 2021-11-02 DIAGNOSIS — R1031 Right lower quadrant pain: Secondary | ICD-10-CM

## 2021-11-02 DIAGNOSIS — M5416 Radiculopathy, lumbar region: Secondary | ICD-10-CM | POA: Diagnosis not present

## 2021-11-02 DIAGNOSIS — M5451 Vertebrogenic low back pain: Secondary | ICD-10-CM | POA: Diagnosis not present

## 2021-11-03 DIAGNOSIS — E7800 Pure hypercholesterolemia, unspecified: Secondary | ICD-10-CM | POA: Diagnosis not present

## 2021-11-03 DIAGNOSIS — I10 Essential (primary) hypertension: Secondary | ICD-10-CM | POA: Diagnosis not present

## 2021-11-03 DIAGNOSIS — E1169 Type 2 diabetes mellitus with other specified complication: Secondary | ICD-10-CM | POA: Diagnosis not present

## 2021-11-03 DIAGNOSIS — R5383 Other fatigue: Secondary | ICD-10-CM | POA: Diagnosis not present

## 2021-11-03 DIAGNOSIS — E119 Type 2 diabetes mellitus without complications: Secondary | ICD-10-CM | POA: Diagnosis not present

## 2021-11-17 DIAGNOSIS — R1031 Right lower quadrant pain: Secondary | ICD-10-CM | POA: Diagnosis not present

## 2021-11-24 DIAGNOSIS — M25562 Pain in left knee: Secondary | ICD-10-CM | POA: Diagnosis not present

## 2021-12-24 DIAGNOSIS — M25562 Pain in left knee: Secondary | ICD-10-CM | POA: Diagnosis not present

## 2022-01-06 DIAGNOSIS — H9313 Tinnitus, bilateral: Secondary | ICD-10-CM | POA: Diagnosis not present

## 2022-01-06 DIAGNOSIS — J309 Allergic rhinitis, unspecified: Secondary | ICD-10-CM | POA: Diagnosis not present

## 2022-01-24 DIAGNOSIS — M25562 Pain in left knee: Secondary | ICD-10-CM | POA: Diagnosis not present

## 2022-01-30 DIAGNOSIS — I10 Essential (primary) hypertension: Secondary | ICD-10-CM | POA: Diagnosis not present

## 2022-01-30 DIAGNOSIS — E119 Type 2 diabetes mellitus without complications: Secondary | ICD-10-CM | POA: Diagnosis not present

## 2022-01-30 DIAGNOSIS — Z7985 Long-term (current) use of injectable non-insulin antidiabetic drugs: Secondary | ICD-10-CM | POA: Diagnosis not present

## 2022-02-13 IMAGING — MG MM DIGITAL SCREENING BILAT W/ TOMO AND CAD
8 series · 8 of 24 positions shown · non-contrast
Comparison: Previous exam(s).

CLINICAL DATA: Screening.

EXAM:
DIGITAL SCREENING BILATERAL MAMMOGRAM WITH TOMOSYNTHESIS AND CAD
TECHNIQUE: Bilateral screening digital craniocaudal and mediolateral oblique
mammograms were obtained. Bilateral screening digital breast
tomosynthesis was performed. The images were evaluated with
computer-aided detection.

[L MLO synth-2D]
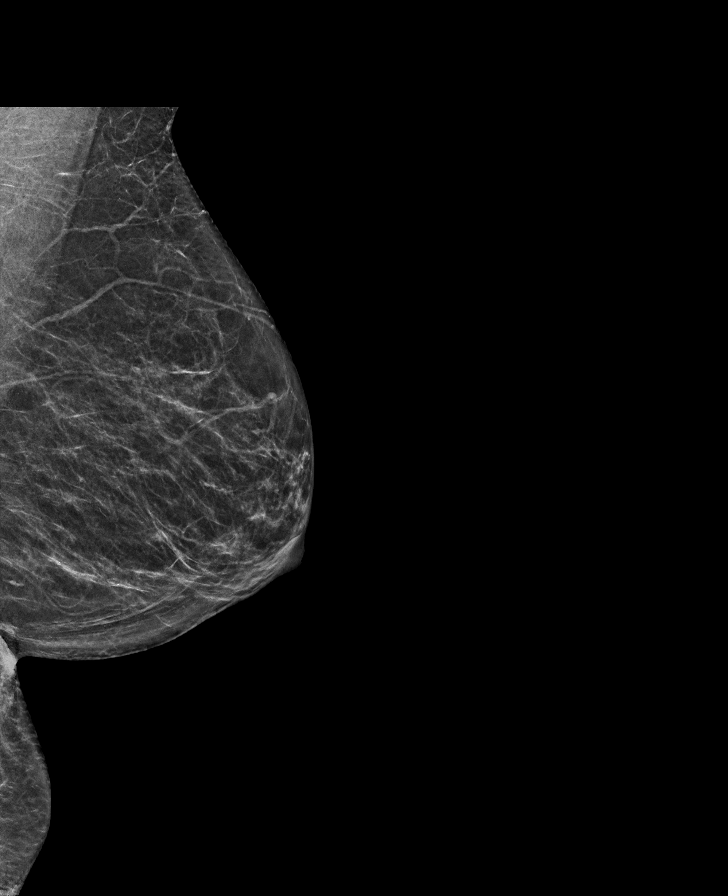

[R MLO synth-2D]
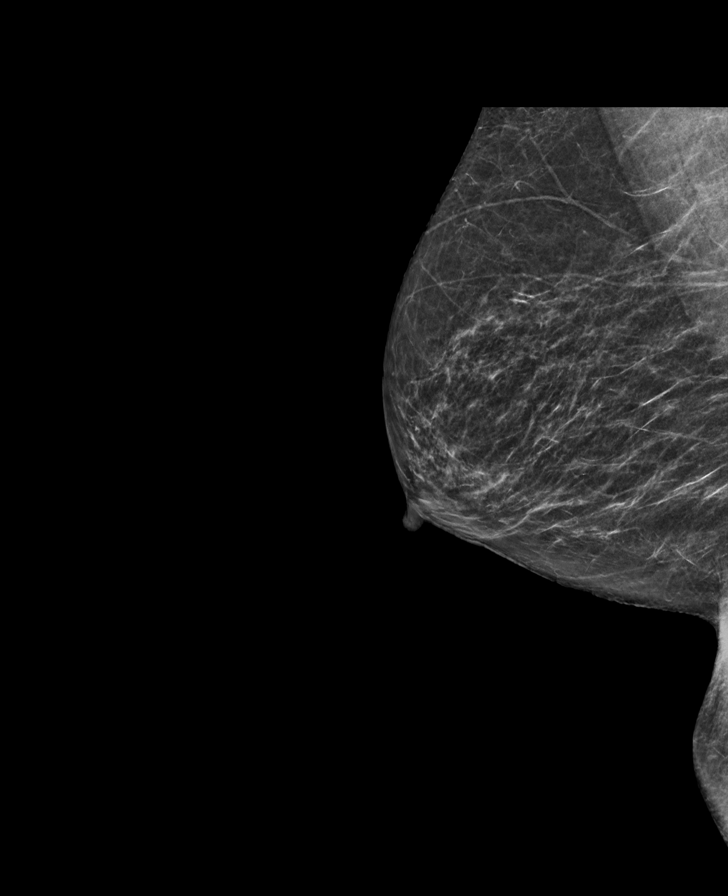

[R CC synth-2D]
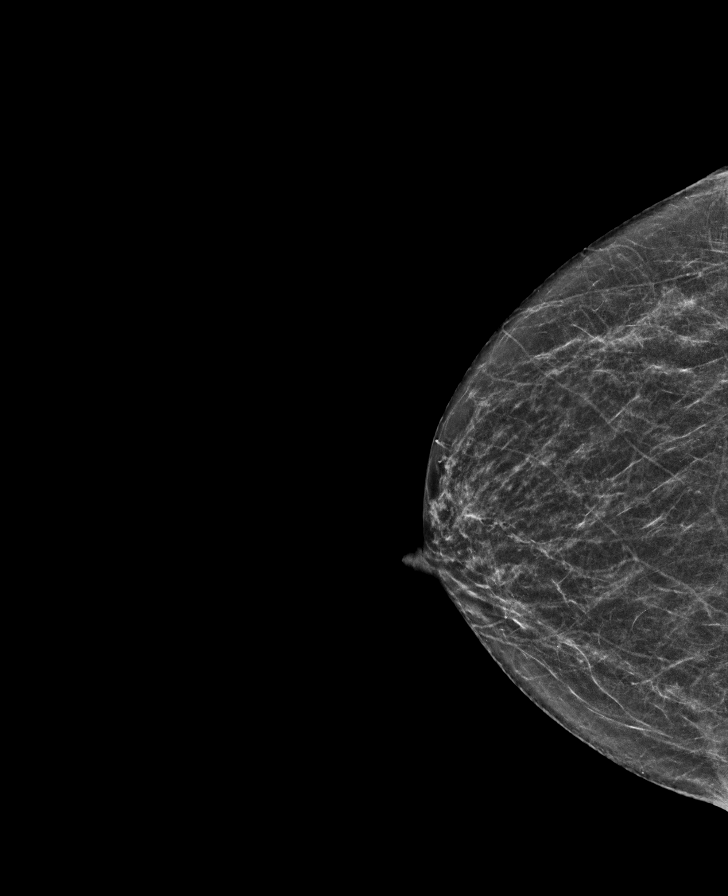

[L CC synth-2D]
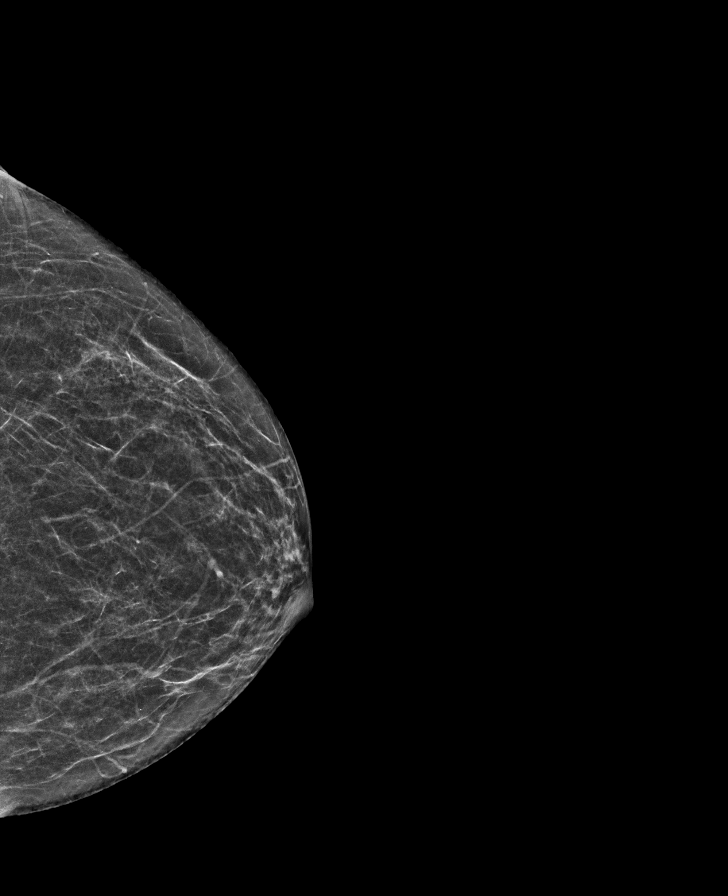

[R CC tomo · tomo slice 28/55.0]
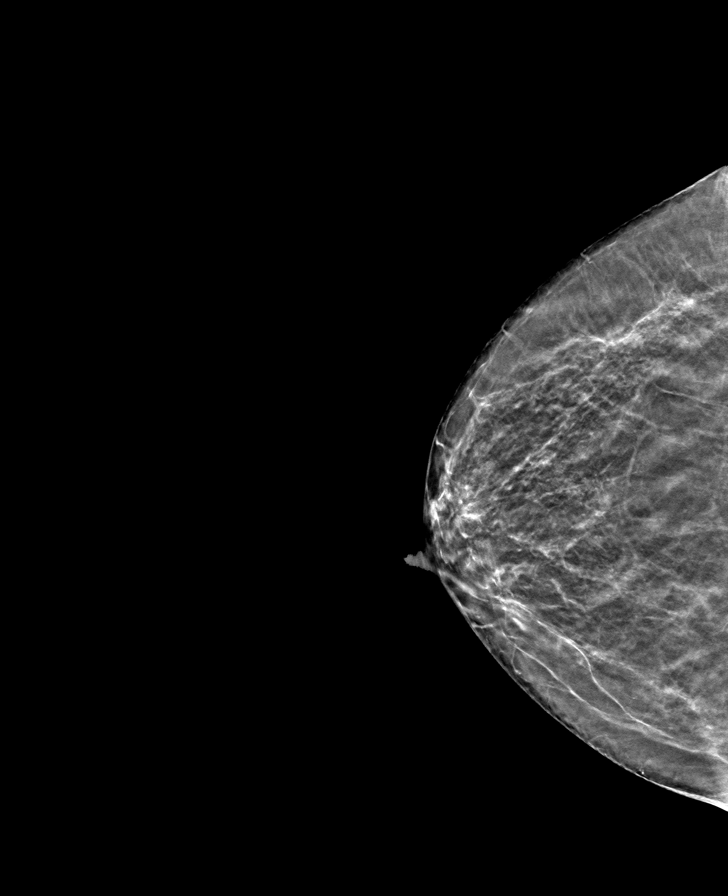

[L CC tomo · tomo slice 27/53.0]
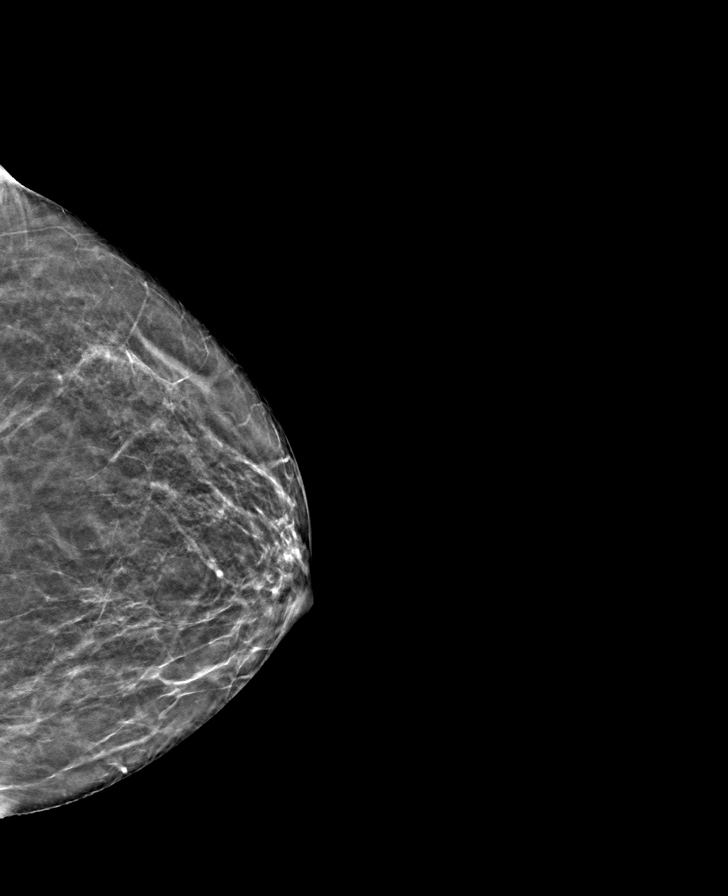

[R MLO tomo · tomo slice 29/56.0]
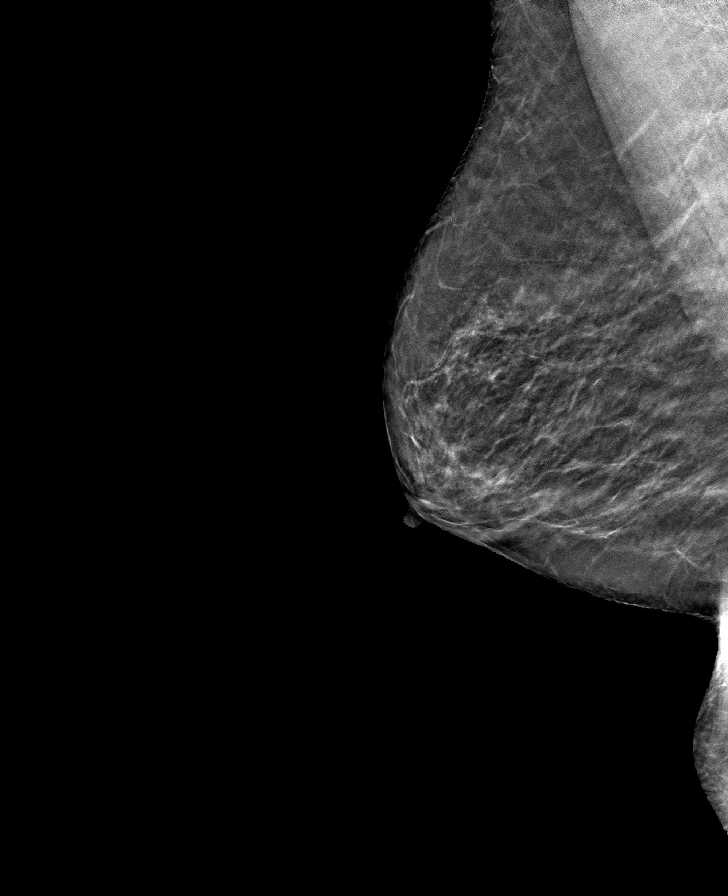

[L MLO tomo · tomo slice 29/57.0]
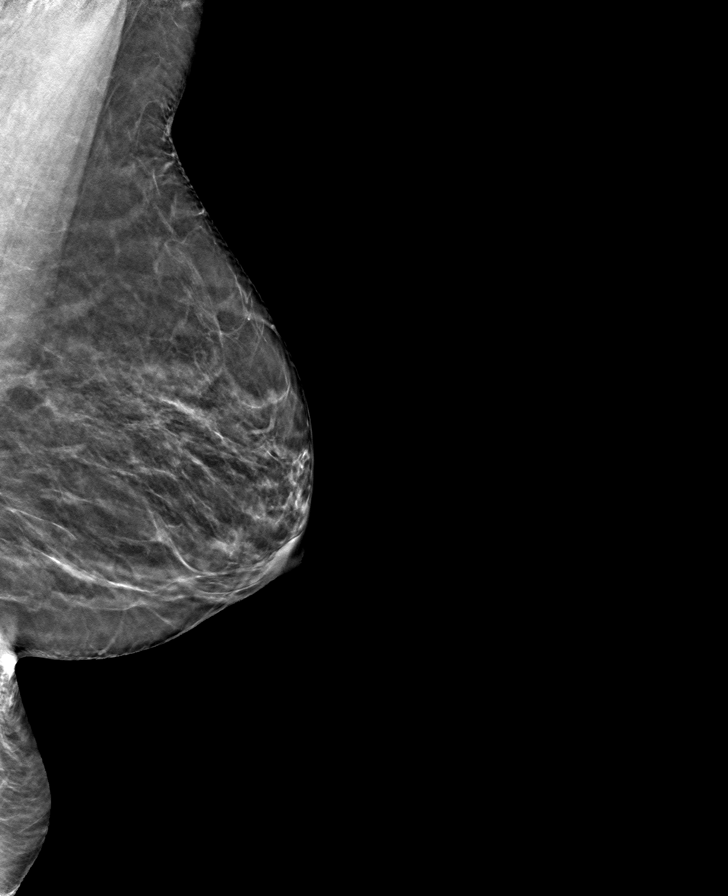

[8 of 24 positions shown; findings below may reference images not displayed]

ACR Breast Density Category b: There are scattered areas of
fibroglandular density.
FINDINGS: There are no findings suspicious for malignancy.
IMPRESSION: No mammographic evidence of malignancy. A result letter of this
screening mammogram will be mailed directly to the patient.

RECOMMENDATION:
Screening mammogram in one year. (Code:51-O-LD2)

BI-RADS CATEGORY  1: Negative.

## 2022-02-23 DIAGNOSIS — M25562 Pain in left knee: Secondary | ICD-10-CM | POA: Diagnosis not present

## 2022-03-15 DIAGNOSIS — H40023 Open angle with borderline findings, high risk, bilateral: Secondary | ICD-10-CM | POA: Diagnosis not present

## 2022-03-15 DIAGNOSIS — H25813 Combined forms of age-related cataract, bilateral: Secondary | ICD-10-CM | POA: Diagnosis not present

## 2022-03-26 DIAGNOSIS — M25562 Pain in left knee: Secondary | ICD-10-CM | POA: Diagnosis not present

## 2022-04-28 ENCOUNTER — Telehealth: Payer: Self-pay

## 2022-05-03 ENCOUNTER — Ambulatory Visit: Payer: Self-pay

## 2022-05-03 NOTE — Patient Outreach (Signed)
  Care Coordination   Initial Visit Note    Name: Ellice Boultinghouse MRN: [id number] DOB: [birth year]  Carisma Troupe Urey is a 71 y.o. year old female who sees Seward Carol, MD for primary care. I spoke with  Janus Molder Cowher by phone today.  What matters to the patients health and wellness today?  No Concerns Expressed. Requested Call Back    Goals Addressed   None     SDOH assessments and interventions completed:  No     Care Coordination Interventions Activated:  No  Care Coordination Interventions:  No, not indicated   Follow up plan:  Will follow up next week.    Encounter Outcome:  Pt. Scheduled   [facility] [phone]

## 2022-05-03 NOTE — Patient Outreach (Signed)
  Care Coordination   05/03/2022 Name: Tiffany Shepherd MRN: [id number] DOB: [birth year]   Care Coordination Outreach Attempts:  An unsuccessful telephone outreach was attempted for a scheduled appointment today.  Follow Up Plan:  Additional outreach attempts will be made to offer the patient care coordination information and services.   Encounter Outcome:  No Answer  Care Coordination Interventions Activated:  No   Care Coordination Interventions:  No, not indicated    [facility] [phone]

## 2022-05-04 DIAGNOSIS — E1169 Type 2 diabetes mellitus with other specified complication: Secondary | ICD-10-CM | POA: Diagnosis not present

## 2022-05-04 DIAGNOSIS — E119 Type 2 diabetes mellitus without complications: Secondary | ICD-10-CM | POA: Diagnosis not present

## 2022-05-04 DIAGNOSIS — I10 Essential (primary) hypertension: Secondary | ICD-10-CM | POA: Diagnosis not present

## 2022-05-04 DIAGNOSIS — R519 Headache, unspecified: Secondary | ICD-10-CM | POA: Diagnosis not present

## 2022-05-04 DIAGNOSIS — E7800 Pure hypercholesterolemia, unspecified: Secondary | ICD-10-CM | POA: Diagnosis not present

## 2022-05-05 ENCOUNTER — Other Ambulatory Visit: Payer: Self-pay | Admitting: Internal Medicine

## 2022-05-05 DIAGNOSIS — R519 Headache, unspecified: Secondary | ICD-10-CM

## 2022-05-17 ENCOUNTER — Other Ambulatory Visit: Payer: Medicare Other

## 2022-05-25 ENCOUNTER — Ambulatory Visit
Admission: RE | Admit: 2022-05-25 | Discharge: 2022-05-25 | Disposition: A | Discharge status: Home / Self Care | Payer: Medicare Other | Source: Ambulatory Visit | Attending: Internal Medicine | Admitting: Internal Medicine

## 2022-05-25 DIAGNOSIS — R519 Headache, unspecified: Secondary | ICD-10-CM

## 2022-05-26 DIAGNOSIS — M25562 Pain in left knee: Secondary | ICD-10-CM | POA: Diagnosis not present

## 2022-06-08 DIAGNOSIS — L578 Other skin changes due to chronic exposure to nonionizing radiation: Secondary | ICD-10-CM | POA: Diagnosis not present

## 2022-06-08 DIAGNOSIS — R202 Paresthesia of skin: Secondary | ICD-10-CM | POA: Diagnosis not present

## 2022-06-12 ENCOUNTER — Other Ambulatory Visit: Payer: Self-pay | Admitting: Internal Medicine

## 2022-06-12 DIAGNOSIS — Z1231 Encounter for screening mammogram for malignant neoplasm of breast: Secondary | ICD-10-CM

## 2022-06-26 DIAGNOSIS — M25562 Pain in left knee: Secondary | ICD-10-CM | POA: Diagnosis not present

## 2022-07-26 DIAGNOSIS — M25562 Pain in left knee: Secondary | ICD-10-CM | POA: Diagnosis not present

## 2022-08-02 ENCOUNTER — Ambulatory Visit
Admission: RE | Admit: 2022-08-02 | Discharge: 2022-08-02 | Disposition: A | Discharge status: Home / Self Care | Payer: Medicare Other | Source: Ambulatory Visit | Attending: Internal Medicine | Admitting: Internal Medicine

## 2022-08-02 DIAGNOSIS — Z1231 Encounter for screening mammogram for malignant neoplasm of breast: Secondary | ICD-10-CM | POA: Diagnosis not present

## 2022-08-26 DIAGNOSIS — M25562 Pain in left knee: Secondary | ICD-10-CM | POA: Diagnosis not present

## 2022-08-30 DIAGNOSIS — H40023 Open angle with borderline findings, high risk, bilateral: Secondary | ICD-10-CM | POA: Diagnosis not present

## 2022-08-30 DIAGNOSIS — H25813 Combined forms of age-related cataract, bilateral: Secondary | ICD-10-CM | POA: Diagnosis not present

## 2022-09-26 DIAGNOSIS — M25562 Pain in left knee: Secondary | ICD-10-CM | POA: Diagnosis not present

## 2022-10-25 DIAGNOSIS — M25562 Pain in left knee: Secondary | ICD-10-CM | POA: Diagnosis not present

## 2022-11-09 DIAGNOSIS — E1169 Type 2 diabetes mellitus with other specified complication: Secondary | ICD-10-CM | POA: Diagnosis not present

## 2022-11-09 DIAGNOSIS — Z0000 Encounter for general adult medical examination without abnormal findings: Secondary | ICD-10-CM | POA: Diagnosis not present

## 2022-11-09 DIAGNOSIS — E119 Type 2 diabetes mellitus without complications: Secondary | ICD-10-CM | POA: Diagnosis not present

## 2022-11-09 DIAGNOSIS — I10 Essential (primary) hypertension: Secondary | ICD-10-CM | POA: Diagnosis not present

## 2022-11-09 DIAGNOSIS — E7800 Pure hypercholesterolemia, unspecified: Secondary | ICD-10-CM | POA: Diagnosis not present

## 2022-11-09 DIAGNOSIS — R350 Frequency of micturition: Secondary | ICD-10-CM | POA: Diagnosis not present

## 2022-11-23 DIAGNOSIS — R350 Frequency of micturition: Secondary | ICD-10-CM | POA: Diagnosis not present

## 2022-11-25 DIAGNOSIS — M25562 Pain in left knee: Secondary | ICD-10-CM | POA: Diagnosis not present

## 2022-12-25 DIAGNOSIS — M25562 Pain in left knee: Secondary | ICD-10-CM | POA: Diagnosis not present

## 2023-01-25 DIAGNOSIS — M25562 Pain in left knee: Secondary | ICD-10-CM | POA: Diagnosis not present

## 2023-02-05 DIAGNOSIS — E119 Type 2 diabetes mellitus without complications: Secondary | ICD-10-CM | POA: Diagnosis not present

## 2023-02-05 DIAGNOSIS — I10 Essential (primary) hypertension: Secondary | ICD-10-CM | POA: Diagnosis not present

## 2023-02-05 DIAGNOSIS — Z7985 Long-term (current) use of injectable non-insulin antidiabetic drugs: Secondary | ICD-10-CM | POA: Diagnosis not present

## 2023-02-15 DIAGNOSIS — L239 Allergic contact dermatitis, unspecified cause: Secondary | ICD-10-CM | POA: Diagnosis not present

## 2023-02-15 DIAGNOSIS — L811 Chloasma: Secondary | ICD-10-CM | POA: Diagnosis not present

## 2023-02-24 DIAGNOSIS — M25562 Pain in left knee: Secondary | ICD-10-CM | POA: Diagnosis not present

## 2023-02-28 DIAGNOSIS — H40023 Open angle with borderline findings, high risk, bilateral: Secondary | ICD-10-CM | POA: Diagnosis not present

## 2023-02-28 DIAGNOSIS — H25813 Combined forms of age-related cataract, bilateral: Secondary | ICD-10-CM | POA: Diagnosis not present

## 2023-03-15 DIAGNOSIS — Z1211 Encounter for screening for malignant neoplasm of colon: Secondary | ICD-10-CM | POA: Diagnosis not present

## 2023-03-15 DIAGNOSIS — K5730 Diverticulosis of large intestine without perforation or abscess without bleeding: Secondary | ICD-10-CM | POA: Diagnosis not present

## 2023-03-27 DIAGNOSIS — M25562 Pain in left knee: Secondary | ICD-10-CM | POA: Diagnosis not present

## 2023-04-03 DIAGNOSIS — E119 Type 2 diabetes mellitus without complications: Secondary | ICD-10-CM | POA: Diagnosis not present

## 2023-04-03 DIAGNOSIS — I10 Essential (primary) hypertension: Secondary | ICD-10-CM | POA: Diagnosis not present

## 2023-04-03 DIAGNOSIS — Z713 Dietary counseling and surveillance: Secondary | ICD-10-CM | POA: Diagnosis not present

## 2023-04-27 DIAGNOSIS — M25511 Pain in right shoulder: Secondary | ICD-10-CM | POA: Diagnosis not present

## 2023-04-27 DIAGNOSIS — M25562 Pain in left knee: Secondary | ICD-10-CM | POA: Diagnosis not present

## 2023-05-24 ENCOUNTER — Other Ambulatory Visit: Payer: Self-pay

## 2023-05-24 ENCOUNTER — Emergency Department (HOSPITAL_COMMUNITY): Payer: 59

## 2023-05-24 ENCOUNTER — Emergency Department (HOSPITAL_COMMUNITY)
Admission: EM | Admit: 2023-05-24 | Discharge: 2023-05-24 | Disposition: A | Discharge status: Home / Self Care | Payer: 59 | Attending: Emergency Medicine | Admitting: Emergency Medicine

## 2023-05-24 ENCOUNTER — Encounter (HOSPITAL_COMMUNITY): Payer: Self-pay

## 2023-05-24 DIAGNOSIS — M1712 Unilateral primary osteoarthritis, left knee: Secondary | ICD-10-CM | POA: Diagnosis not present

## 2023-05-24 DIAGNOSIS — I10 Essential (primary) hypertension: Secondary | ICD-10-CM | POA: Diagnosis not present

## 2023-05-24 DIAGNOSIS — M25561 Pain in right knee: Secondary | ICD-10-CM | POA: Insufficient documentation

## 2023-05-24 DIAGNOSIS — Z7982 Long term (current) use of aspirin: Secondary | ICD-10-CM | POA: Diagnosis not present

## 2023-05-24 DIAGNOSIS — M25562 Pain in left knee: Secondary | ICD-10-CM | POA: Insufficient documentation

## 2023-05-24 DIAGNOSIS — R404 Transient alteration of awareness: Secondary | ICD-10-CM | POA: Diagnosis not present

## 2023-05-24 DIAGNOSIS — Z743 Need for continuous supervision: Secondary | ICD-10-CM | POA: Diagnosis not present

## 2023-05-24 DIAGNOSIS — R42 Dizziness and giddiness: Secondary | ICD-10-CM | POA: Diagnosis not present

## 2023-05-24 DIAGNOSIS — M85862 Other specified disorders of bone density and structure, left lower leg: Secondary | ICD-10-CM | POA: Diagnosis not present

## 2023-05-24 DIAGNOSIS — M1711 Unilateral primary osteoarthritis, right knee: Secondary | ICD-10-CM | POA: Diagnosis not present

## 2023-05-24 DIAGNOSIS — W19XXXA Unspecified fall, initial encounter: Secondary | ICD-10-CM | POA: Diagnosis not present

## 2023-05-24 NOTE — ED Triage Notes (Addendum)
Pt arrives via ems to the er for the c/o fall and bilateral knee pain. Hx vertigo [place] and [place] and landed on her knees. Did not hit pt head, not on blood thinners. Slight swelling noted to right knee. VSS. Hx vertigo, pt states this seems as it usually does when she has an episode of vertigo. Pt was able to stand and walk with help per ems.

## 2023-05-24 NOTE — Discharge Instructions (Signed)
Tiffany Shepherd:  Thank you for allowing [place] to take care of you today.  We hope you begin feeling better soon. You were seen today for knee pain.  X-ray images of your knee were unremarkable, which is good news.  To-Do: Please follow-up with your primary doctor to schedule an appointment with a new primary care doctor within the next 2-3 days.  Please return to the Emergency Department or call 911 if you experience worsening knee pain, chest pain, shortness of breath, severe pain, severe fever, altered mental status, or have any reason to think that you need emergency medical care.  Thank you again.  Hope you feel better soon.

## 2023-05-24 NOTE — ED Provider Triage Note (Signed)
Emergency Medicine Provider Triage Evaluation Note  Tiffany Shepherd , a 72 y.o. female  was evaluated in triage.  Pt complains of bilateral knee pain.  Patient was at [employer] today, she states that she had a brief episode of vertigo, something that is not uncommon for her.  She states that she fell down forward and landed on both knees.  She did not hit her head or lose consciousness.  She tried to get up but could not, prompting call to EMS.  Currently complains of bilateral knee pain and swelling.  No wounds.  Review of Systems  Positive: Knee pain Negative: Headache  Physical Exam  BP (!) 152/101 (BP Location: Left Arm)   Pulse 87   Temp (!) 97.5 F (36.4 C)   Resp 17   Ht 5\' 5"  (1.651 m)   Wt 85.7 kg   SpO2 100%   BMI 31.45 kg/m  Gen:   Awake, no distress   Resp:  Normal effort  MSK:   Moves extremities without difficulty  Other:  Mild tenderness over the anterior knees bilaterally, skin visualized, no abrasions or lacerations  Medical Decision Making  Medically screening exam initiated at 12:59 PM.  Appropriate orders placed.  Soniyah Mcglory Watkin was informed that the remainder of the evaluation will be completed by another provider, this initial triage assessment does not replace that evaluation, and the importance of remaining in the ED until their evaluation is complete.  Patient appears to be stable for the waiting room.   Renne Crigler, PA-C 05/24/23 1300

## 2023-05-24 NOTE — ED Provider Notes (Signed)
  Physical Exam  BP (!) 152/101 (BP Location: Left Arm)   Pulse 87   Temp (!) 97.5 F (36.4 C)   Resp 17   Ht 5\' 5"  (1.651 m)   Wt 85.7 kg   SpO2 100%   BMI 31.45 kg/m   Physical Exam Constitutional:      General: She is not in acute distress.    Appearance: She is not ill-appearing.  Musculoskeletal:        General: No tenderness, deformity or signs of injury.     Right lower leg: No edema.     Left lower leg: No edema.  Skin:    General: Skin is warm and dry.  Neurological:     Mental Status: She is alert.      Procedures  Procedures  ED Course / MDM   Clinical Course as of 05/24/23 1529  Thu May 24, 2023  1525 S. Shopping today, got dizzy, fell onto knees - pain bilaterally. Dizzy better w/ meclizine. F/u XR_ [KM]    Clinical Course User Index [KM] Lyman Speller, MD   Medical Decision Making  Signout taken from outgoing ED team.  In brief, this is a 72 year old female past medical history of chronic back pain, hyperlipidemia, hypertension, vertigo presenting for evaluation of dizziness and knee pain.  Patient reportedly developed a dizzy episode while walking at the mall, causing her to fall forward onto her knees.  She was able to catch herself before hitting her head.  Remarks that the dizziness is very similar to her prior vertiginous symptoms.  Dizziness did improve with meclizine.  However, given her continued knee pain, x-rays of the bilateral knees were obtained which showed mild degenerative changes, but no acute traumatic findings.  Specifically, no fracture or malalignment.  On reassessment, patient is well-appearing.  She is sitting in bed and states that she is asymptomatic.  She has walked to the bathroom without issue.  She states that she is ready to go home and is excited to leave the emergency department.  Pain management strategies were discussed with the patient.  She is to follow-up with primary care for additional recommendations.  Strict return  precautions provided.     Lyman Speller, MD 05/24/23 1617    Margarita Grizzle, MD 05/28/23 [phone]

## 2023-05-24 NOTE — ED Provider Notes (Signed)
[place] EMERGENCY DEPARTMENT AT [facility] Provider Note   CSN: [id number] Arrival date & time: 05/24/23  1245     History  Chief Complaint  Patient presents with   Knee Pain   Fall    Tiffany Shepherd is a 72 y.o. female with medical history of chronic back pain, high cholesterol, hypertension.  Patient presents to ED for evaluation of dizziness and knee pain.  Patient states that she was at the mall earlier today shopping.  States that at 1 point she became slightly dizzy, has a history of vertigo.  She reports that she attempted to walk forward however the dizziness caused her to fall down to her knees.  She denies hitting her head during this event, losing consciousness, taking blood thinning medication.  She reports that she was immediately helped to a chair at this time by bystander.  On examination she denies any current dizziness.  She is complaining of bilateral knee pain as she reports that she fell onto her knees during this event.  Denies one-sided weakness or numbness, nausea, vomiting, headache, neck pain.   Knee Pain Associated symptoms: no neck pain   Fall Pertinent negatives include no headaches.       Home Medications Prior to Admission medications   Medication Sig Start Date End Date Taking? Authorizing Provider  aspirin 81 MG tablet Take 81 mg by mouth daily.    [provider]  atorvastatin (LIPITOR) 20 MG tablet Take 20 mg by mouth every evening.    [provider]  cholecalciferol (VITAMIN D) 400 UNITS TABS tablet Take 400 Units by mouth daily.    [provider]  Coenzyme Q10 (CO Q 10 PO) Take 10 mg by mouth daily.    [provider]  cycloSPORINE (RESTASIS) 0.05 % ophthalmic emulsion 1 drop 2 (two) times daily.    [provider]  diclofenac sodium (VOLTAREN) 1 % GEL Apply 2 g topically 4 (four) times daily. 12/06/16   Vivi Barrack, DPM  levocetirizine (XYZAL) 5 MG tablet Take 5 mg by  mouth as needed.     [provider]  meclizine (ANTIVERT) 25 MG tablet Take 25 mg by mouth 3 (three) times daily as needed. For vertigo    [provider]  Multiple Vitamins-Minerals (ALIVE WOMENS 50+ PO) Take 1 tablet by mouth daily.    [provider]  NONFORMULARY OR COMPOUNDED ITEM [facility]  Onychomycosis Nail Lacquer -  Fluconazole 2%, Terbinafine 1% DMSO Apply to affected nail once daily Qty. 120 gm 3 refills    [provider]  NONFORMULARY OR COMPOUNDED ITEM [facility]  Urea Cream - 40% Urea Apply 1-2 grams to affected area 3-4 times daily Qty. 120 gm 3 refills    [provider]  NONFORMULARY OR COMPOUNDED ITEM [facility]: Antiinflammatory Cream - Diclofenac 3%, Baclofen 2%, Lidocaine 2%, apply 1-2 grams to affected area 3-4 times daily. 12/04/16   Vivi Barrack, DPM  Omega-3 Fatty Acids (FISH OIL) 1000 MG CAPS Take 1 capsule by mouth daily.    [provider]  phentermine (ADIPEX-P) 37.5 MG tablet  10/16/16   [provider]  valsartan-hydrochlorothiazide (DIOVAN-HCT) 160-12.5 MG per tablet Take 1 tablet by mouth daily.    [provider]  vitamin C (ASCORBIC ACID) 500 MG tablet Take 1,000 mg by mouth daily.     [provider]  vitamin E 400 UNIT capsule Take 400 Units by mouth daily.  [provider]      Allergies    Fluoxetine, Amoxicillin, Mobic [meloxicam], Penicillins, and Percocet [oxycodone-acetaminophen]    Review of Systems   Review of Systems  Musculoskeletal:  Positive for arthralgias. Negative for neck pain.  Neurological:  Positive for dizziness. Negative for headaches.  All other systems reviewed and are negative.   Physical Exam Updated Vital Signs BP (!) 152/101 (BP Location: Left Arm)   Pulse 87   Temp (!) 97.5 F (36.4 C)   Resp 17   Ht 5\' 5"  (1.651 m)   Wt 85.7 kg   SpO2 100%   BMI 31.45 kg/m  Physical Exam Vitals and  nursing note reviewed.  Constitutional:      General: She is not in acute distress.    Appearance: Normal appearance. She is not ill-appearing, toxic-appearing or diaphoretic.  HENT:     Head: Normocephalic and atraumatic.     Nose: Nose normal.     Mouth/Throat:     Mouth: Mucous membranes are moist.     Pharynx: Oropharynx is clear.  Eyes:     Extraocular Movements: Extraocular movements intact.     Conjunctiva/sclera: Conjunctivae normal.     Pupils: Pupils are equal, round, and reactive to light.  Cardiovascular:     Rate and Rhythm: Normal rate and regular rhythm.  Pulmonary:     Effort: Pulmonary effort is normal.     Breath sounds: Normal breath sounds. No wheezing.  Abdominal:     General: Abdomen is flat. Bowel sounds are normal.     Palpations: Abdomen is soft.     Tenderness: There is no abdominal tenderness.  Musculoskeletal:     Cervical back: Normal range of motion and neck supple. No tenderness.     Comments: Full range of motion of bilateral knees with tenderness to bilateral patellas.  Skin:    General: Skin is warm and dry.     Capillary Refill: Capillary refill takes less than 2 seconds.  Neurological:     General: No focal deficit present.     Mental Status: She is alert and oriented to person, place, and time.     GCS: GCS eye subscore is 4. GCS verbal subscore is 5. GCS motor subscore is 6.     Cranial Nerves: Cranial nerves 2-12 are intact. No cranial nerve deficit.     Sensory: Sensation is intact. No sensory deficit.     Motor: Motor function is intact. No weakness.     Comments: No focal deficits on exam     ED Results / Procedures / Treatments   Labs (all labs ordered are listed, but only abnormal results are displayed) Labs Reviewed - No data to display  EKG None  Radiology No results found.  Procedures Procedures   Medications Ordered in ED Medications - No data to display  ED Course/ Medical Decision Making/ A&P Clinical Course  as of 05/24/23 1531  Thu May 24, 2023  1525 S. Shopping today, got dizzy, fell onto knees - pain bilaterally. Dizzy better w/ meclizine. F/u XR_ [KM]    Clinical Course User Index [KM] Lyman Speller, MD    Medical Decision Making  72 year old female presents to ED for evaluation.  Please see HPI for further details.  On examination patient denies any dizziness currently.  She is complaining of pain to her bilateral knees.  She has full range of motion of bilateral knees.  Reassured neurological examination.  Will collect plain film images of bilateral  knees.  Have offered patient ibuprofen however she states that she does not take "pain medication".  Will provide her ice.  Signed out to oncoming resident Dr. Ledon Snare.   Final Clinical Impression(s) / ED Diagnoses Final diagnoses:  Pain in both knees, unspecified chronicity  Dizziness    Rx / DC Orders ED Discharge Orders     None         Al Decant, PA-C 05/24/23 1531    Arby Barrette, MD 05/25/23 [phone]

## 2023-05-27 DIAGNOSIS — M25562 Pain in left knee: Secondary | ICD-10-CM | POA: Diagnosis not present

## 2023-06-02 DIAGNOSIS — Y999 Unspecified external cause status: Secondary | ICD-10-CM | POA: Diagnosis not present

## 2023-06-02 DIAGNOSIS — S76811A Strain of other specified muscles, fascia and tendons at thigh level, right thigh, initial encounter: Secondary | ICD-10-CM | POA: Diagnosis not present

## 2023-06-02 DIAGNOSIS — M25561 Pain in right knee: Secondary | ICD-10-CM | POA: Diagnosis not present

## 2023-06-02 DIAGNOSIS — Z743 Need for continuous supervision: Secondary | ICD-10-CM | POA: Diagnosis not present

## 2023-06-02 DIAGNOSIS — E785 Hyperlipidemia, unspecified: Secondary | ICD-10-CM | POA: Diagnosis not present

## 2023-06-02 DIAGNOSIS — M1611 Unilateral primary osteoarthritis, right hip: Secondary | ICD-10-CM | POA: Diagnosis not present

## 2023-06-02 DIAGNOSIS — S83281A Other tear of lateral meniscus, current injury, right knee, initial encounter: Secondary | ICD-10-CM | POA: Diagnosis not present

## 2023-06-02 DIAGNOSIS — I10 Essential (primary) hypertension: Secondary | ICD-10-CM | POA: Diagnosis not present

## 2023-06-02 DIAGNOSIS — M948X8 Other specified disorders of cartilage, other site: Secondary | ICD-10-CM | POA: Diagnosis not present

## 2023-06-02 DIAGNOSIS — M6788 Other specified disorders of synovium and tendon, other site: Secondary | ICD-10-CM | POA: Diagnosis not present

## 2023-06-02 DIAGNOSIS — S8991XA Unspecified injury of right lower leg, initial encounter: Secondary | ICD-10-CM | POA: Diagnosis not present

## 2023-06-02 DIAGNOSIS — M25461 Effusion, right knee: Secondary | ICD-10-CM | POA: Diagnosis not present

## 2023-06-02 DIAGNOSIS — M1711 Unilateral primary osteoarthritis, right knee: Secondary | ICD-10-CM | POA: Diagnosis not present

## 2023-06-02 DIAGNOSIS — M7989 Other specified soft tissue disorders: Secondary | ICD-10-CM | POA: Diagnosis not present

## 2023-06-02 DIAGNOSIS — R6889 Other general symptoms and signs: Secondary | ICD-10-CM | POA: Diagnosis not present

## 2023-06-02 DIAGNOSIS — M8580 Other specified disorders of bone density and structure, unspecified site: Secondary | ICD-10-CM | POA: Diagnosis not present

## 2023-06-02 DIAGNOSIS — M79604 Pain in right leg: Secondary | ICD-10-CM | POA: Diagnosis not present

## 2023-06-02 DIAGNOSIS — G8929 Other chronic pain: Secondary | ICD-10-CM | POA: Diagnosis not present

## 2023-06-02 DIAGNOSIS — W19XXXA Unspecified fall, initial encounter: Secondary | ICD-10-CM | POA: Diagnosis not present

## 2023-06-03 DIAGNOSIS — Y999 Unspecified external cause status: Secondary | ICD-10-CM | POA: Diagnosis not present

## 2023-06-03 DIAGNOSIS — S8991XA Unspecified injury of right lower leg, initial encounter: Secondary | ICD-10-CM | POA: Diagnosis not present

## 2023-06-03 DIAGNOSIS — M948X8 Other specified disorders of cartilage, other site: Secondary | ICD-10-CM | POA: Diagnosis not present

## 2023-06-03 DIAGNOSIS — M6788 Other specified disorders of synovium and tendon, other site: Secondary | ICD-10-CM | POA: Diagnosis not present

## 2023-06-03 DIAGNOSIS — S83281A Other tear of lateral meniscus, current injury, right knee, initial encounter: Secondary | ICD-10-CM | POA: Diagnosis not present

## 2023-06-03 DIAGNOSIS — S76811A Strain of other specified muscles, fascia and tendons at thigh level, right thigh, initial encounter: Secondary | ICD-10-CM | POA: Diagnosis not present

## 2023-06-03 DIAGNOSIS — M25561 Pain in right knee: Secondary | ICD-10-CM | POA: Diagnosis not present

## 2023-06-13 DIAGNOSIS — Y92512 Supermarket, store or market as the place of occurrence of the external cause: Secondary | ICD-10-CM | POA: Diagnosis not present

## 2023-06-13 DIAGNOSIS — W010XXA Fall on same level from slipping, tripping and stumbling without subsequent striking against object, initial encounter: Secondary | ICD-10-CM | POA: Diagnosis not present

## 2023-06-13 DIAGNOSIS — M1711 Unilateral primary osteoarthritis, right knee: Secondary | ICD-10-CM | POA: Diagnosis not present

## 2023-06-13 DIAGNOSIS — S83251A Bucket-handle tear of lateral meniscus, current injury, right knee, initial encounter: Secondary | ICD-10-CM | POA: Diagnosis not present

## 2023-06-27 DIAGNOSIS — H25813 Combined forms of age-related cataract, bilateral: Secondary | ICD-10-CM | POA: Diagnosis not present

## 2023-06-27 DIAGNOSIS — M25562 Pain in left knee: Secondary | ICD-10-CM | POA: Diagnosis not present

## 2023-06-27 DIAGNOSIS — M1711 Unilateral primary osteoarthritis, right knee: Secondary | ICD-10-CM | POA: Diagnosis not present

## 2023-06-27 DIAGNOSIS — S83251D Bucket-handle tear of lateral meniscus, current injury, right knee, subsequent encounter: Secondary | ICD-10-CM | POA: Diagnosis not present

## 2023-06-27 DIAGNOSIS — H40023 Open angle with borderline findings, high risk, bilateral: Secondary | ICD-10-CM | POA: Diagnosis not present

## 2023-06-27 DIAGNOSIS — W010XXD Fall on same level from slipping, tripping and stumbling without subsequent striking against object, subsequent encounter: Secondary | ICD-10-CM | POA: Diagnosis not present

## 2023-07-04 DIAGNOSIS — Y92512 Supermarket, store or market as the place of occurrence of the external cause: Secondary | ICD-10-CM | POA: Diagnosis not present

## 2023-07-04 DIAGNOSIS — M1711 Unilateral primary osteoarthritis, right knee: Secondary | ICD-10-CM | POA: Diagnosis not present

## 2023-07-04 DIAGNOSIS — W010XXA Fall on same level from slipping, tripping and stumbling without subsequent striking against object, initial encounter: Secondary | ICD-10-CM | POA: Diagnosis not present

## 2023-07-04 DIAGNOSIS — M25461 Effusion, right knee: Secondary | ICD-10-CM | POA: Diagnosis not present

## 2023-07-04 DIAGNOSIS — S83251A Bucket-handle tear of lateral meniscus, current injury, right knee, initial encounter: Secondary | ICD-10-CM | POA: Diagnosis not present

## 2023-07-11 DIAGNOSIS — Z9181 History of falling: Secondary | ICD-10-CM | POA: Diagnosis not present

## 2023-07-11 DIAGNOSIS — R29898 Other symptoms and signs involving the musculoskeletal system: Secondary | ICD-10-CM | POA: Diagnosis not present

## 2023-07-11 DIAGNOSIS — M1711 Unilateral primary osteoarthritis, right knee: Secondary | ICD-10-CM | POA: Diagnosis not present

## 2023-07-11 DIAGNOSIS — G8929 Other chronic pain: Secondary | ICD-10-CM | POA: Diagnosis not present

## 2023-07-11 DIAGNOSIS — S83251D Bucket-handle tear of lateral meniscus, current injury, right knee, subsequent encounter: Secondary | ICD-10-CM | POA: Diagnosis not present

## 2023-07-11 DIAGNOSIS — R269 Unspecified abnormalities of gait and mobility: Secondary | ICD-10-CM | POA: Diagnosis not present

## 2023-07-17 DIAGNOSIS — R29898 Other symptoms and signs involving the musculoskeletal system: Secondary | ICD-10-CM | POA: Diagnosis not present

## 2023-07-17 DIAGNOSIS — Z9181 History of falling: Secondary | ICD-10-CM | POA: Diagnosis not present

## 2023-07-17 DIAGNOSIS — S83251D Bucket-handle tear of lateral meniscus, current injury, right knee, subsequent encounter: Secondary | ICD-10-CM | POA: Diagnosis not present

## 2023-07-17 DIAGNOSIS — G8929 Other chronic pain: Secondary | ICD-10-CM | POA: Diagnosis not present

## 2023-07-17 DIAGNOSIS — R269 Unspecified abnormalities of gait and mobility: Secondary | ICD-10-CM | POA: Diagnosis not present

## 2023-07-17 DIAGNOSIS — M1711 Unilateral primary osteoarthritis, right knee: Secondary | ICD-10-CM | POA: Diagnosis not present

## 2023-07-23 DIAGNOSIS — R29898 Other symptoms and signs involving the musculoskeletal system: Secondary | ICD-10-CM | POA: Diagnosis not present

## 2023-07-23 DIAGNOSIS — Z9181 History of falling: Secondary | ICD-10-CM | POA: Diagnosis not present

## 2023-07-23 DIAGNOSIS — M1711 Unilateral primary osteoarthritis, right knee: Secondary | ICD-10-CM | POA: Diagnosis not present

## 2023-07-23 DIAGNOSIS — R269 Unspecified abnormalities of gait and mobility: Secondary | ICD-10-CM | POA: Diagnosis not present

## 2023-07-23 DIAGNOSIS — G8929 Other chronic pain: Secondary | ICD-10-CM | POA: Diagnosis not present

## 2023-07-23 DIAGNOSIS — S83251D Bucket-handle tear of lateral meniscus, current injury, right knee, subsequent encounter: Secondary | ICD-10-CM | POA: Diagnosis not present

## 2023-07-23 DIAGNOSIS — M25561 Pain in right knee: Secondary | ICD-10-CM | POA: Diagnosis not present

## 2023-07-27 DIAGNOSIS — M25561 Pain in right knee: Secondary | ICD-10-CM | POA: Diagnosis not present

## 2023-07-27 DIAGNOSIS — M1711 Unilateral primary osteoarthritis, right knee: Secondary | ICD-10-CM | POA: Diagnosis not present

## 2023-07-27 DIAGNOSIS — R269 Unspecified abnormalities of gait and mobility: Secondary | ICD-10-CM | POA: Diagnosis not present

## 2023-07-27 DIAGNOSIS — S83251D Bucket-handle tear of lateral meniscus, current injury, right knee, subsequent encounter: Secondary | ICD-10-CM | POA: Diagnosis not present

## 2023-07-27 DIAGNOSIS — G8929 Other chronic pain: Secondary | ICD-10-CM | POA: Diagnosis not present

## 2023-07-27 DIAGNOSIS — M25562 Pain in left knee: Secondary | ICD-10-CM | POA: Diagnosis not present

## 2023-07-27 DIAGNOSIS — Z9181 History of falling: Secondary | ICD-10-CM | POA: Diagnosis not present

## 2023-07-27 DIAGNOSIS — R29898 Other symptoms and signs involving the musculoskeletal system: Secondary | ICD-10-CM | POA: Diagnosis not present

## 2023-08-01 DIAGNOSIS — G8929 Other chronic pain: Secondary | ICD-10-CM | POA: Diagnosis not present

## 2023-08-01 DIAGNOSIS — M1711 Unilateral primary osteoarthritis, right knee: Secondary | ICD-10-CM | POA: Diagnosis not present

## 2023-08-01 DIAGNOSIS — S83251D Bucket-handle tear of lateral meniscus, current injury, right knee, subsequent encounter: Secondary | ICD-10-CM | POA: Diagnosis not present

## 2023-08-01 DIAGNOSIS — R29898 Other symptoms and signs involving the musculoskeletal system: Secondary | ICD-10-CM | POA: Diagnosis not present

## 2023-08-01 DIAGNOSIS — M25561 Pain in right knee: Secondary | ICD-10-CM | POA: Diagnosis not present

## 2023-08-01 DIAGNOSIS — Z9181 History of falling: Secondary | ICD-10-CM | POA: Diagnosis not present

## 2023-08-01 DIAGNOSIS — R269 Unspecified abnormalities of gait and mobility: Secondary | ICD-10-CM | POA: Diagnosis not present

## 2023-08-03 DIAGNOSIS — M1711 Unilateral primary osteoarthritis, right knee: Secondary | ICD-10-CM | POA: Diagnosis not present

## 2023-08-03 DIAGNOSIS — R29898 Other symptoms and signs involving the musculoskeletal system: Secondary | ICD-10-CM | POA: Diagnosis not present

## 2023-08-03 DIAGNOSIS — M25561 Pain in right knee: Secondary | ICD-10-CM | POA: Diagnosis not present

## 2023-08-03 DIAGNOSIS — Z9181 History of falling: Secondary | ICD-10-CM | POA: Diagnosis not present

## 2023-08-03 DIAGNOSIS — G8929 Other chronic pain: Secondary | ICD-10-CM | POA: Diagnosis not present

## 2023-08-03 DIAGNOSIS — S83251D Bucket-handle tear of lateral meniscus, current injury, right knee, subsequent encounter: Secondary | ICD-10-CM | POA: Diagnosis not present

## 2023-08-03 DIAGNOSIS — R269 Unspecified abnormalities of gait and mobility: Secondary | ICD-10-CM | POA: Diagnosis not present

## 2023-08-04 DIAGNOSIS — G8929 Other chronic pain: Secondary | ICD-10-CM | POA: Diagnosis not present

## 2023-08-04 DIAGNOSIS — S83251D Bucket-handle tear of lateral meniscus, current injury, right knee, subsequent encounter: Secondary | ICD-10-CM | POA: Diagnosis not present

## 2023-08-04 DIAGNOSIS — R269 Unspecified abnormalities of gait and mobility: Secondary | ICD-10-CM | POA: Diagnosis not present

## 2023-08-04 DIAGNOSIS — R29898 Other symptoms and signs involving the musculoskeletal system: Secondary | ICD-10-CM | POA: Diagnosis not present

## 2023-08-04 DIAGNOSIS — M25561 Pain in right knee: Secondary | ICD-10-CM | POA: Diagnosis not present

## 2023-08-04 DIAGNOSIS — M1711 Unilateral primary osteoarthritis, right knee: Secondary | ICD-10-CM | POA: Diagnosis not present

## 2023-08-04 DIAGNOSIS — Z9181 History of falling: Secondary | ICD-10-CM | POA: Diagnosis not present

## 2023-08-10 DIAGNOSIS — M1711 Unilateral primary osteoarthritis, right knee: Secondary | ICD-10-CM | POA: Diagnosis not present

## 2023-08-10 DIAGNOSIS — M25561 Pain in right knee: Secondary | ICD-10-CM | POA: Diagnosis not present

## 2023-08-10 DIAGNOSIS — R29898 Other symptoms and signs involving the musculoskeletal system: Secondary | ICD-10-CM | POA: Diagnosis not present

## 2023-08-10 DIAGNOSIS — S83251D Bucket-handle tear of lateral meniscus, current injury, right knee, subsequent encounter: Secondary | ICD-10-CM | POA: Diagnosis not present

## 2023-08-10 DIAGNOSIS — R269 Unspecified abnormalities of gait and mobility: Secondary | ICD-10-CM | POA: Diagnosis not present

## 2023-08-10 DIAGNOSIS — G8929 Other chronic pain: Secondary | ICD-10-CM | POA: Diagnosis not present

## 2023-08-10 DIAGNOSIS — Z9181 History of falling: Secondary | ICD-10-CM | POA: Diagnosis not present

## 2023-08-16 DIAGNOSIS — G8929 Other chronic pain: Secondary | ICD-10-CM | POA: Diagnosis not present

## 2023-08-16 DIAGNOSIS — R29898 Other symptoms and signs involving the musculoskeletal system: Secondary | ICD-10-CM | POA: Diagnosis not present

## 2023-08-16 DIAGNOSIS — M25561 Pain in right knee: Secondary | ICD-10-CM | POA: Diagnosis not present

## 2023-08-16 DIAGNOSIS — M1711 Unilateral primary osteoarthritis, right knee: Secondary | ICD-10-CM | POA: Diagnosis not present

## 2023-08-16 DIAGNOSIS — S83251D Bucket-handle tear of lateral meniscus, current injury, right knee, subsequent encounter: Secondary | ICD-10-CM | POA: Diagnosis not present

## 2023-08-16 DIAGNOSIS — R269 Unspecified abnormalities of gait and mobility: Secondary | ICD-10-CM | POA: Diagnosis not present

## 2023-08-16 DIAGNOSIS — Z9181 History of falling: Secondary | ICD-10-CM | POA: Diagnosis not present

## 2023-08-27 DIAGNOSIS — M25562 Pain in left knee: Secondary | ICD-10-CM | POA: Diagnosis not present

## 2023-08-30 DIAGNOSIS — S83251D Bucket-handle tear of lateral meniscus, current injury, right knee, subsequent encounter: Secondary | ICD-10-CM | POA: Diagnosis not present

## 2023-08-30 DIAGNOSIS — R29898 Other symptoms and signs involving the musculoskeletal system: Secondary | ICD-10-CM | POA: Diagnosis not present

## 2023-08-30 DIAGNOSIS — M25561 Pain in right knee: Secondary | ICD-10-CM | POA: Diagnosis not present

## 2023-08-30 DIAGNOSIS — G8929 Other chronic pain: Secondary | ICD-10-CM | POA: Diagnosis not present

## 2023-08-30 DIAGNOSIS — M1711 Unilateral primary osteoarthritis, right knee: Secondary | ICD-10-CM | POA: Diagnosis not present

## 2023-08-30 DIAGNOSIS — R269 Unspecified abnormalities of gait and mobility: Secondary | ICD-10-CM | POA: Diagnosis not present

## 2023-08-30 DIAGNOSIS — Z9181 History of falling: Secondary | ICD-10-CM | POA: Diagnosis not present

## 2023-09-05 DIAGNOSIS — G8929 Other chronic pain: Secondary | ICD-10-CM | POA: Diagnosis not present

## 2023-09-05 DIAGNOSIS — R269 Unspecified abnormalities of gait and mobility: Secondary | ICD-10-CM | POA: Diagnosis not present

## 2023-09-05 DIAGNOSIS — R29898 Other symptoms and signs involving the musculoskeletal system: Secondary | ICD-10-CM | POA: Diagnosis not present

## 2023-09-05 DIAGNOSIS — Z9181 History of falling: Secondary | ICD-10-CM | POA: Diagnosis not present

## 2023-09-05 DIAGNOSIS — S83251D Bucket-handle tear of lateral meniscus, current injury, right knee, subsequent encounter: Secondary | ICD-10-CM | POA: Diagnosis not present

## 2023-09-05 DIAGNOSIS — M25561 Pain in right knee: Secondary | ICD-10-CM | POA: Diagnosis not present

## 2023-09-05 DIAGNOSIS — M1711 Unilateral primary osteoarthritis, right knee: Secondary | ICD-10-CM | POA: Diagnosis not present

## 2023-09-13 DIAGNOSIS — R269 Unspecified abnormalities of gait and mobility: Secondary | ICD-10-CM | POA: Diagnosis not present

## 2023-09-13 DIAGNOSIS — M1711 Unilateral primary osteoarthritis, right knee: Secondary | ICD-10-CM | POA: Diagnosis not present

## 2023-09-13 DIAGNOSIS — G8929 Other chronic pain: Secondary | ICD-10-CM | POA: Diagnosis not present

## 2023-09-13 DIAGNOSIS — M25561 Pain in right knee: Secondary | ICD-10-CM | POA: Diagnosis not present

## 2023-09-13 DIAGNOSIS — S83251D Bucket-handle tear of lateral meniscus, current injury, right knee, subsequent encounter: Secondary | ICD-10-CM | POA: Diagnosis not present

## 2023-09-13 DIAGNOSIS — R29898 Other symptoms and signs involving the musculoskeletal system: Secondary | ICD-10-CM | POA: Diagnosis not present

## 2023-09-13 DIAGNOSIS — Z9181 History of falling: Secondary | ICD-10-CM | POA: Diagnosis not present

## 2023-09-20 DIAGNOSIS — M1711 Unilateral primary osteoarthritis, right knee: Secondary | ICD-10-CM | POA: Diagnosis not present

## 2023-09-20 DIAGNOSIS — S83251D Bucket-handle tear of lateral meniscus, current injury, right knee, subsequent encounter: Secondary | ICD-10-CM | POA: Diagnosis not present

## 2023-09-20 DIAGNOSIS — M25561 Pain in right knee: Secondary | ICD-10-CM | POA: Diagnosis not present

## 2023-09-20 DIAGNOSIS — Z9181 History of falling: Secondary | ICD-10-CM | POA: Diagnosis not present

## 2023-09-20 DIAGNOSIS — R269 Unspecified abnormalities of gait and mobility: Secondary | ICD-10-CM | POA: Diagnosis not present

## 2023-09-20 DIAGNOSIS — R29898 Other symptoms and signs involving the musculoskeletal system: Secondary | ICD-10-CM | POA: Diagnosis not present

## 2023-09-20 DIAGNOSIS — G8929 Other chronic pain: Secondary | ICD-10-CM | POA: Diagnosis not present

## 2023-09-27 DIAGNOSIS — R269 Unspecified abnormalities of gait and mobility: Secondary | ICD-10-CM | POA: Diagnosis not present

## 2023-09-27 DIAGNOSIS — M25561 Pain in right knee: Secondary | ICD-10-CM | POA: Diagnosis not present

## 2023-09-27 DIAGNOSIS — M25562 Pain in left knee: Secondary | ICD-10-CM | POA: Diagnosis not present

## 2023-09-27 DIAGNOSIS — G8929 Other chronic pain: Secondary | ICD-10-CM | POA: Diagnosis not present

## 2023-09-27 DIAGNOSIS — M1711 Unilateral primary osteoarthritis, right knee: Secondary | ICD-10-CM | POA: Diagnosis not present

## 2023-09-27 DIAGNOSIS — Z9181 History of falling: Secondary | ICD-10-CM | POA: Diagnosis not present

## 2023-09-27 DIAGNOSIS — R29898 Other symptoms and signs involving the musculoskeletal system: Secondary | ICD-10-CM | POA: Diagnosis not present

## 2023-09-27 DIAGNOSIS — S83251D Bucket-handle tear of lateral meniscus, current injury, right knee, subsequent encounter: Secondary | ICD-10-CM | POA: Diagnosis not present

## 2023-10-08 DIAGNOSIS — M722 Plantar fascial fibromatosis: Secondary | ICD-10-CM | POA: Diagnosis not present

## 2023-10-08 DIAGNOSIS — L603 Nail dystrophy: Secondary | ICD-10-CM | POA: Diagnosis not present

## 2023-10-08 DIAGNOSIS — M79672 Pain in left foot: Secondary | ICD-10-CM | POA: Diagnosis not present

## 2023-10-08 DIAGNOSIS — M79671 Pain in right foot: Secondary | ICD-10-CM | POA: Diagnosis not present

## 2023-10-17 DIAGNOSIS — G8929 Other chronic pain: Secondary | ICD-10-CM | POA: Diagnosis not present

## 2023-10-17 DIAGNOSIS — S83251D Bucket-handle tear of lateral meniscus, current injury, right knee, subsequent encounter: Secondary | ICD-10-CM | POA: Diagnosis not present

## 2023-10-17 DIAGNOSIS — Z9181 History of falling: Secondary | ICD-10-CM | POA: Diagnosis not present

## 2023-10-17 DIAGNOSIS — R269 Unspecified abnormalities of gait and mobility: Secondary | ICD-10-CM | POA: Diagnosis not present

## 2023-10-17 DIAGNOSIS — M25561 Pain in right knee: Secondary | ICD-10-CM | POA: Diagnosis not present

## 2023-10-17 DIAGNOSIS — M1711 Unilateral primary osteoarthritis, right knee: Secondary | ICD-10-CM | POA: Diagnosis not present

## 2023-10-17 DIAGNOSIS — R29898 Other symptoms and signs involving the musculoskeletal system: Secondary | ICD-10-CM | POA: Diagnosis not present

## 2023-10-20 DIAGNOSIS — R269 Unspecified abnormalities of gait and mobility: Secondary | ICD-10-CM | POA: Diagnosis not present

## 2023-10-20 DIAGNOSIS — M1711 Unilateral primary osteoarthritis, right knee: Secondary | ICD-10-CM | POA: Diagnosis not present

## 2023-10-20 DIAGNOSIS — M25561 Pain in right knee: Secondary | ICD-10-CM | POA: Diagnosis not present

## 2023-10-20 DIAGNOSIS — R29898 Other symptoms and signs involving the musculoskeletal system: Secondary | ICD-10-CM | POA: Diagnosis not present

## 2023-10-20 DIAGNOSIS — G8929 Other chronic pain: Secondary | ICD-10-CM | POA: Diagnosis not present

## 2023-10-20 DIAGNOSIS — S83251D Bucket-handle tear of lateral meniscus, current injury, right knee, subsequent encounter: Secondary | ICD-10-CM | POA: Diagnosis not present

## 2023-10-20 DIAGNOSIS — Z9181 History of falling: Secondary | ICD-10-CM | POA: Diagnosis not present

## 2023-10-25 DIAGNOSIS — Z9181 History of falling: Secondary | ICD-10-CM | POA: Diagnosis not present

## 2023-10-25 DIAGNOSIS — G8929 Other chronic pain: Secondary | ICD-10-CM | POA: Diagnosis not present

## 2023-10-25 DIAGNOSIS — R29898 Other symptoms and signs involving the musculoskeletal system: Secondary | ICD-10-CM | POA: Diagnosis not present

## 2023-10-25 DIAGNOSIS — M25562 Pain in left knee: Secondary | ICD-10-CM | POA: Diagnosis not present

## 2023-10-25 DIAGNOSIS — M1711 Unilateral primary osteoarthritis, right knee: Secondary | ICD-10-CM | POA: Diagnosis not present

## 2023-10-25 DIAGNOSIS — R269 Unspecified abnormalities of gait and mobility: Secondary | ICD-10-CM | POA: Diagnosis not present

## 2023-10-25 DIAGNOSIS — S83251D Bucket-handle tear of lateral meniscus, current injury, right knee, subsequent encounter: Secondary | ICD-10-CM | POA: Diagnosis not present

## 2023-10-25 DIAGNOSIS — M25511 Pain in right shoulder: Secondary | ICD-10-CM | POA: Diagnosis not present

## 2023-10-25 DIAGNOSIS — M25561 Pain in right knee: Secondary | ICD-10-CM | POA: Diagnosis not present

## 2023-11-01 DIAGNOSIS — R29898 Other symptoms and signs involving the musculoskeletal system: Secondary | ICD-10-CM | POA: Diagnosis not present

## 2023-11-01 DIAGNOSIS — M25561 Pain in right knee: Secondary | ICD-10-CM | POA: Diagnosis not present

## 2023-11-01 DIAGNOSIS — S83251D Bucket-handle tear of lateral meniscus, current injury, right knee, subsequent encounter: Secondary | ICD-10-CM | POA: Diagnosis not present

## 2023-11-01 DIAGNOSIS — Z9181 History of falling: Secondary | ICD-10-CM | POA: Diagnosis not present

## 2023-11-01 DIAGNOSIS — G8929 Other chronic pain: Secondary | ICD-10-CM | POA: Diagnosis not present

## 2023-11-01 DIAGNOSIS — M1711 Unilateral primary osteoarthritis, right knee: Secondary | ICD-10-CM | POA: Diagnosis not present

## 2023-11-01 DIAGNOSIS — R269 Unspecified abnormalities of gait and mobility: Secondary | ICD-10-CM | POA: Diagnosis not present

## 2023-11-10 DIAGNOSIS — Z9181 History of falling: Secondary | ICD-10-CM | POA: Diagnosis not present

## 2023-11-10 DIAGNOSIS — R29898 Other symptoms and signs involving the musculoskeletal system: Secondary | ICD-10-CM | POA: Diagnosis not present

## 2023-11-10 DIAGNOSIS — S83251D Bucket-handle tear of lateral meniscus, current injury, right knee, subsequent encounter: Secondary | ICD-10-CM | POA: Diagnosis not present

## 2023-11-10 DIAGNOSIS — M1711 Unilateral primary osteoarthritis, right knee: Secondary | ICD-10-CM | POA: Diagnosis not present

## 2023-11-10 DIAGNOSIS — R269 Unspecified abnormalities of gait and mobility: Secondary | ICD-10-CM | POA: Diagnosis not present

## 2023-11-10 DIAGNOSIS — M25561 Pain in right knee: Secondary | ICD-10-CM | POA: Diagnosis not present

## 2023-11-10 DIAGNOSIS — G8929 Other chronic pain: Secondary | ICD-10-CM | POA: Diagnosis not present

## 2023-11-15 DIAGNOSIS — R269 Unspecified abnormalities of gait and mobility: Secondary | ICD-10-CM | POA: Diagnosis not present

## 2023-11-15 DIAGNOSIS — M1711 Unilateral primary osteoarthritis, right knee: Secondary | ICD-10-CM | POA: Diagnosis not present

## 2023-11-15 DIAGNOSIS — M25561 Pain in right knee: Secondary | ICD-10-CM | POA: Diagnosis not present

## 2023-11-15 DIAGNOSIS — Z9181 History of falling: Secondary | ICD-10-CM | POA: Diagnosis not present

## 2023-11-15 DIAGNOSIS — G8929 Other chronic pain: Secondary | ICD-10-CM | POA: Diagnosis not present

## 2023-11-15 DIAGNOSIS — S83251D Bucket-handle tear of lateral meniscus, current injury, right knee, subsequent encounter: Secondary | ICD-10-CM | POA: Diagnosis not present

## 2023-11-15 DIAGNOSIS — R29898 Other symptoms and signs involving the musculoskeletal system: Secondary | ICD-10-CM | POA: Diagnosis not present

## 2023-11-20 DIAGNOSIS — L811 Chloasma: Secondary | ICD-10-CM | POA: Diagnosis not present

## 2023-11-22 DIAGNOSIS — G8929 Other chronic pain: Secondary | ICD-10-CM | POA: Diagnosis not present

## 2023-11-22 DIAGNOSIS — Z9181 History of falling: Secondary | ICD-10-CM | POA: Diagnosis not present

## 2023-11-22 DIAGNOSIS — M1711 Unilateral primary osteoarthritis, right knee: Secondary | ICD-10-CM | POA: Diagnosis not present

## 2023-11-22 DIAGNOSIS — S83251D Bucket-handle tear of lateral meniscus, current injury, right knee, subsequent encounter: Secondary | ICD-10-CM | POA: Diagnosis not present

## 2023-11-22 DIAGNOSIS — M25561 Pain in right knee: Secondary | ICD-10-CM | POA: Diagnosis not present

## 2023-11-22 DIAGNOSIS — R269 Unspecified abnormalities of gait and mobility: Secondary | ICD-10-CM | POA: Diagnosis not present

## 2023-11-22 DIAGNOSIS — R29898 Other symptoms and signs involving the musculoskeletal system: Secondary | ICD-10-CM | POA: Diagnosis not present

## 2023-11-25 DIAGNOSIS — M25562 Pain in left knee: Secondary | ICD-10-CM | POA: Diagnosis not present

## 2023-11-29 DIAGNOSIS — R29898 Other symptoms and signs involving the musculoskeletal system: Secondary | ICD-10-CM | POA: Diagnosis not present

## 2023-11-29 DIAGNOSIS — M25561 Pain in right knee: Secondary | ICD-10-CM | POA: Diagnosis not present

## 2023-11-29 DIAGNOSIS — R269 Unspecified abnormalities of gait and mobility: Secondary | ICD-10-CM | POA: Diagnosis not present

## 2023-11-29 DIAGNOSIS — Z9181 History of falling: Secondary | ICD-10-CM | POA: Diagnosis not present

## 2023-11-29 DIAGNOSIS — S83251D Bucket-handle tear of lateral meniscus, current injury, right knee, subsequent encounter: Secondary | ICD-10-CM | POA: Diagnosis not present

## 2023-11-29 DIAGNOSIS — M1711 Unilateral primary osteoarthritis, right knee: Secondary | ICD-10-CM | POA: Diagnosis not present

## 2023-11-29 DIAGNOSIS — G8929 Other chronic pain: Secondary | ICD-10-CM | POA: Diagnosis not present

## 2023-11-30 DIAGNOSIS — Z1231 Encounter for screening mammogram for malignant neoplasm of breast: Secondary | ICD-10-CM | POA: Diagnosis not present

## 2023-12-06 DIAGNOSIS — Z9181 History of falling: Secondary | ICD-10-CM | POA: Diagnosis not present

## 2023-12-06 DIAGNOSIS — S83251D Bucket-handle tear of lateral meniscus, current injury, right knee, subsequent encounter: Secondary | ICD-10-CM | POA: Diagnosis not present

## 2023-12-06 DIAGNOSIS — M25561 Pain in right knee: Secondary | ICD-10-CM | POA: Diagnosis not present

## 2023-12-06 DIAGNOSIS — M1711 Unilateral primary osteoarthritis, right knee: Secondary | ICD-10-CM | POA: Diagnosis not present

## 2023-12-06 DIAGNOSIS — R269 Unspecified abnormalities of gait and mobility: Secondary | ICD-10-CM | POA: Diagnosis not present

## 2023-12-06 DIAGNOSIS — G8929 Other chronic pain: Secondary | ICD-10-CM | POA: Diagnosis not present

## 2023-12-06 DIAGNOSIS — R29898 Other symptoms and signs involving the musculoskeletal system: Secondary | ICD-10-CM | POA: Diagnosis not present

## 2023-12-12 DIAGNOSIS — M25561 Pain in right knee: Secondary | ICD-10-CM | POA: Diagnosis not present

## 2023-12-12 DIAGNOSIS — M1711 Unilateral primary osteoarthritis, right knee: Secondary | ICD-10-CM | POA: Diagnosis not present

## 2023-12-12 DIAGNOSIS — R29898 Other symptoms and signs involving the musculoskeletal system: Secondary | ICD-10-CM | POA: Diagnosis not present

## 2023-12-12 DIAGNOSIS — S83251D Bucket-handle tear of lateral meniscus, current injury, right knee, subsequent encounter: Secondary | ICD-10-CM | POA: Diagnosis not present

## 2023-12-12 DIAGNOSIS — R269 Unspecified abnormalities of gait and mobility: Secondary | ICD-10-CM | POA: Diagnosis not present

## 2023-12-12 DIAGNOSIS — G8929 Other chronic pain: Secondary | ICD-10-CM | POA: Diagnosis not present

## 2023-12-12 DIAGNOSIS — Z9181 History of falling: Secondary | ICD-10-CM | POA: Diagnosis not present

## 2023-12-13 DIAGNOSIS — I10 Essential (primary) hypertension: Secondary | ICD-10-CM | POA: Diagnosis not present

## 2023-12-13 DIAGNOSIS — Z5181 Encounter for therapeutic drug level monitoring: Secondary | ICD-10-CM | POA: Diagnosis not present

## 2023-12-13 DIAGNOSIS — E7800 Pure hypercholesterolemia, unspecified: Secondary | ICD-10-CM | POA: Diagnosis not present

## 2023-12-13 DIAGNOSIS — N1831 Chronic kidney disease, stage 3a: Secondary | ICD-10-CM | POA: Diagnosis not present

## 2023-12-13 DIAGNOSIS — E1122 Type 2 diabetes mellitus with diabetic chronic kidney disease: Secondary | ICD-10-CM | POA: Diagnosis not present

## 2023-12-13 DIAGNOSIS — Z0000 Encounter for general adult medical examination without abnormal findings: Secondary | ICD-10-CM | POA: Diagnosis not present

## 2023-12-18 DIAGNOSIS — G8929 Other chronic pain: Secondary | ICD-10-CM | POA: Diagnosis not present

## 2023-12-18 DIAGNOSIS — Z9181 History of falling: Secondary | ICD-10-CM | POA: Diagnosis not present

## 2023-12-18 DIAGNOSIS — M25561 Pain in right knee: Secondary | ICD-10-CM | POA: Diagnosis not present

## 2023-12-18 DIAGNOSIS — S83251D Bucket-handle tear of lateral meniscus, current injury, right knee, subsequent encounter: Secondary | ICD-10-CM | POA: Diagnosis not present

## 2023-12-18 DIAGNOSIS — M1711 Unilateral primary osteoarthritis, right knee: Secondary | ICD-10-CM | POA: Diagnosis not present

## 2023-12-18 DIAGNOSIS — R29898 Other symptoms and signs involving the musculoskeletal system: Secondary | ICD-10-CM | POA: Diagnosis not present

## 2023-12-18 DIAGNOSIS — R269 Unspecified abnormalities of gait and mobility: Secondary | ICD-10-CM | POA: Diagnosis not present

## 2023-12-25 DIAGNOSIS — R29898 Other symptoms and signs involving the musculoskeletal system: Secondary | ICD-10-CM | POA: Diagnosis not present

## 2023-12-25 DIAGNOSIS — M25562 Pain in left knee: Secondary | ICD-10-CM | POA: Diagnosis not present

## 2023-12-25 DIAGNOSIS — Z9181 History of falling: Secondary | ICD-10-CM | POA: Diagnosis not present

## 2023-12-25 DIAGNOSIS — G8929 Other chronic pain: Secondary | ICD-10-CM | POA: Diagnosis not present

## 2023-12-25 DIAGNOSIS — S83251D Bucket-handle tear of lateral meniscus, current injury, right knee, subsequent encounter: Secondary | ICD-10-CM | POA: Diagnosis not present

## 2023-12-25 DIAGNOSIS — R269 Unspecified abnormalities of gait and mobility: Secondary | ICD-10-CM | POA: Diagnosis not present

## 2023-12-25 DIAGNOSIS — M1711 Unilateral primary osteoarthritis, right knee: Secondary | ICD-10-CM | POA: Diagnosis not present

## 2023-12-25 DIAGNOSIS — M25561 Pain in right knee: Secondary | ICD-10-CM | POA: Diagnosis not present

## 2023-12-27 DIAGNOSIS — H25813 Combined forms of age-related cataract, bilateral: Secondary | ICD-10-CM | POA: Diagnosis not present

## 2023-12-27 DIAGNOSIS — H40023 Open angle with borderline findings, high risk, bilateral: Secondary | ICD-10-CM | POA: Diagnosis not present

## 2023-12-27 DIAGNOSIS — H43811 Vitreous degeneration, right eye: Secondary | ICD-10-CM | POA: Diagnosis not present

## 2023-12-27 DIAGNOSIS — E119 Type 2 diabetes mellitus without complications: Secondary | ICD-10-CM | POA: Diagnosis not present

## 2024-01-02 DIAGNOSIS — M25561 Pain in right knee: Secondary | ICD-10-CM | POA: Diagnosis not present

## 2024-01-02 DIAGNOSIS — G8929 Other chronic pain: Secondary | ICD-10-CM | POA: Diagnosis not present

## 2024-01-02 DIAGNOSIS — M1711 Unilateral primary osteoarthritis, right knee: Secondary | ICD-10-CM | POA: Diagnosis not present

## 2024-01-02 DIAGNOSIS — R29898 Other symptoms and signs involving the musculoskeletal system: Secondary | ICD-10-CM | POA: Diagnosis not present

## 2024-01-02 DIAGNOSIS — R269 Unspecified abnormalities of gait and mobility: Secondary | ICD-10-CM | POA: Diagnosis not present

## 2024-01-02 DIAGNOSIS — S83251D Bucket-handle tear of lateral meniscus, current injury, right knee, subsequent encounter: Secondary | ICD-10-CM | POA: Diagnosis not present

## 2024-01-02 DIAGNOSIS — Z9181 History of falling: Secondary | ICD-10-CM | POA: Diagnosis not present

## 2024-01-18 DIAGNOSIS — S83251D Bucket-handle tear of lateral meniscus, current injury, right knee, subsequent encounter: Secondary | ICD-10-CM | POA: Diagnosis not present

## 2024-01-18 DIAGNOSIS — R29898 Other symptoms and signs involving the musculoskeletal system: Secondary | ICD-10-CM | POA: Diagnosis not present

## 2024-01-18 DIAGNOSIS — R269 Unspecified abnormalities of gait and mobility: Secondary | ICD-10-CM | POA: Diagnosis not present

## 2024-01-18 DIAGNOSIS — M1711 Unilateral primary osteoarthritis, right knee: Secondary | ICD-10-CM | POA: Diagnosis not present

## 2024-01-18 DIAGNOSIS — M25561 Pain in right knee: Secondary | ICD-10-CM | POA: Diagnosis not present

## 2024-01-18 DIAGNOSIS — Z9181 History of falling: Secondary | ICD-10-CM | POA: Diagnosis not present

## 2024-01-18 DIAGNOSIS — G8929 Other chronic pain: Secondary | ICD-10-CM | POA: Diagnosis not present

## 2024-01-21 DIAGNOSIS — E119 Type 2 diabetes mellitus without complications: Secondary | ICD-10-CM | POA: Diagnosis not present

## 2024-01-24 DIAGNOSIS — R29898 Other symptoms and signs involving the musculoskeletal system: Secondary | ICD-10-CM | POA: Diagnosis not present

## 2024-01-24 DIAGNOSIS — R269 Unspecified abnormalities of gait and mobility: Secondary | ICD-10-CM | POA: Diagnosis not present

## 2024-01-24 DIAGNOSIS — M25561 Pain in right knee: Secondary | ICD-10-CM | POA: Diagnosis not present

## 2024-01-24 DIAGNOSIS — M1711 Unilateral primary osteoarthritis, right knee: Secondary | ICD-10-CM | POA: Diagnosis not present

## 2024-01-24 DIAGNOSIS — G8929 Other chronic pain: Secondary | ICD-10-CM | POA: Diagnosis not present

## 2024-01-24 DIAGNOSIS — S83251D Bucket-handle tear of lateral meniscus, current injury, right knee, subsequent encounter: Secondary | ICD-10-CM | POA: Diagnosis not present

## 2024-01-24 DIAGNOSIS — Z9181 History of falling: Secondary | ICD-10-CM | POA: Diagnosis not present

## 2024-01-25 DIAGNOSIS — M25562 Pain in left knee: Secondary | ICD-10-CM | POA: Diagnosis not present

## 2024-01-31 DIAGNOSIS — S83251D Bucket-handle tear of lateral meniscus, current injury, right knee, subsequent encounter: Secondary | ICD-10-CM | POA: Diagnosis not present

## 2024-01-31 DIAGNOSIS — R269 Unspecified abnormalities of gait and mobility: Secondary | ICD-10-CM | POA: Diagnosis not present

## 2024-01-31 DIAGNOSIS — R29898 Other symptoms and signs involving the musculoskeletal system: Secondary | ICD-10-CM | POA: Diagnosis not present

## 2024-01-31 DIAGNOSIS — G8929 Other chronic pain: Secondary | ICD-10-CM | POA: Diagnosis not present

## 2024-01-31 DIAGNOSIS — M25561 Pain in right knee: Secondary | ICD-10-CM | POA: Diagnosis not present

## 2024-01-31 DIAGNOSIS — Z9181 History of falling: Secondary | ICD-10-CM | POA: Diagnosis not present

## 2024-01-31 DIAGNOSIS — M1711 Unilateral primary osteoarthritis, right knee: Secondary | ICD-10-CM | POA: Diagnosis not present

## 2024-02-15 DIAGNOSIS — R29898 Other symptoms and signs involving the musculoskeletal system: Secondary | ICD-10-CM | POA: Diagnosis not present

## 2024-02-15 DIAGNOSIS — Z9181 History of falling: Secondary | ICD-10-CM | POA: Diagnosis not present

## 2024-02-15 DIAGNOSIS — R269 Unspecified abnormalities of gait and mobility: Secondary | ICD-10-CM | POA: Diagnosis not present

## 2024-02-15 DIAGNOSIS — M25561 Pain in right knee: Secondary | ICD-10-CM | POA: Diagnosis not present

## 2024-02-15 DIAGNOSIS — S83251D Bucket-handle tear of lateral meniscus, current injury, right knee, subsequent encounter: Secondary | ICD-10-CM | POA: Diagnosis not present

## 2024-02-15 DIAGNOSIS — G8929 Other chronic pain: Secondary | ICD-10-CM | POA: Diagnosis not present

## 2024-02-15 DIAGNOSIS — M1711 Unilateral primary osteoarthritis, right knee: Secondary | ICD-10-CM | POA: Diagnosis not present

## 2024-02-24 DIAGNOSIS — M25562 Pain in left knee: Secondary | ICD-10-CM | POA: Diagnosis not present

## 2024-02-26 DIAGNOSIS — R269 Unspecified abnormalities of gait and mobility: Secondary | ICD-10-CM | POA: Diagnosis not present

## 2024-02-26 DIAGNOSIS — R29898 Other symptoms and signs involving the musculoskeletal system: Secondary | ICD-10-CM | POA: Diagnosis not present

## 2024-02-26 DIAGNOSIS — G8929 Other chronic pain: Secondary | ICD-10-CM | POA: Diagnosis not present

## 2024-02-26 DIAGNOSIS — S83251D Bucket-handle tear of lateral meniscus, current injury, right knee, subsequent encounter: Secondary | ICD-10-CM | POA: Diagnosis not present

## 2024-02-26 DIAGNOSIS — M25561 Pain in right knee: Secondary | ICD-10-CM | POA: Diagnosis not present

## 2024-02-26 DIAGNOSIS — Z9181 History of falling: Secondary | ICD-10-CM | POA: Diagnosis not present

## 2024-02-26 DIAGNOSIS — M1711 Unilateral primary osteoarthritis, right knee: Secondary | ICD-10-CM | POA: Diagnosis not present

## 2024-03-04 DIAGNOSIS — S83251D Bucket-handle tear of lateral meniscus, current injury, right knee, subsequent encounter: Secondary | ICD-10-CM | POA: Diagnosis not present

## 2024-03-04 DIAGNOSIS — M1711 Unilateral primary osteoarthritis, right knee: Secondary | ICD-10-CM | POA: Diagnosis not present

## 2024-03-04 DIAGNOSIS — Z9181 History of falling: Secondary | ICD-10-CM | POA: Diagnosis not present

## 2024-03-04 DIAGNOSIS — R29898 Other symptoms and signs involving the musculoskeletal system: Secondary | ICD-10-CM | POA: Diagnosis not present

## 2024-03-04 DIAGNOSIS — R269 Unspecified abnormalities of gait and mobility: Secondary | ICD-10-CM | POA: Diagnosis not present

## 2024-03-04 DIAGNOSIS — M25561 Pain in right knee: Secondary | ICD-10-CM | POA: Diagnosis not present

## 2024-03-04 DIAGNOSIS — G8929 Other chronic pain: Secondary | ICD-10-CM | POA: Diagnosis not present

## 2024-03-13 DIAGNOSIS — G8929 Other chronic pain: Secondary | ICD-10-CM | POA: Diagnosis not present

## 2024-03-13 DIAGNOSIS — M1711 Unilateral primary osteoarthritis, right knee: Secondary | ICD-10-CM | POA: Diagnosis not present

## 2024-03-13 DIAGNOSIS — S83251D Bucket-handle tear of lateral meniscus, current injury, right knee, subsequent encounter: Secondary | ICD-10-CM | POA: Diagnosis not present

## 2024-03-13 DIAGNOSIS — R269 Unspecified abnormalities of gait and mobility: Secondary | ICD-10-CM | POA: Diagnosis not present

## 2024-03-13 DIAGNOSIS — R29898 Other symptoms and signs involving the musculoskeletal system: Secondary | ICD-10-CM | POA: Diagnosis not present

## 2024-03-13 DIAGNOSIS — M25561 Pain in right knee: Secondary | ICD-10-CM | POA: Diagnosis not present

## 2024-03-13 DIAGNOSIS — Z9181 History of falling: Secondary | ICD-10-CM | POA: Diagnosis not present

## 2024-03-26 DIAGNOSIS — M25562 Pain in left knee: Secondary | ICD-10-CM | POA: Diagnosis not present

## 2024-04-03 DIAGNOSIS — Z5181 Encounter for therapeutic drug level monitoring: Secondary | ICD-10-CM | POA: Diagnosis not present

## 2024-04-03 DIAGNOSIS — R079 Chest pain, unspecified: Secondary | ICD-10-CM | POA: Diagnosis not present

## 2024-04-03 DIAGNOSIS — R0789 Other chest pain: Secondary | ICD-10-CM | POA: Diagnosis not present

## 2024-04-03 DIAGNOSIS — G8929 Other chronic pain: Secondary | ICD-10-CM | POA: Diagnosis not present

## 2024-04-03 DIAGNOSIS — R269 Unspecified abnormalities of gait and mobility: Secondary | ICD-10-CM | POA: Diagnosis not present

## 2024-04-03 DIAGNOSIS — M25561 Pain in right knee: Secondary | ICD-10-CM | POA: Diagnosis not present

## 2024-04-03 DIAGNOSIS — Z9181 History of falling: Secondary | ICD-10-CM | POA: Diagnosis not present

## 2024-04-03 DIAGNOSIS — Z79899 Other long term (current) drug therapy: Secondary | ICD-10-CM | POA: Diagnosis not present

## 2024-04-03 DIAGNOSIS — S83251D Bucket-handle tear of lateral meniscus, current injury, right knee, subsequent encounter: Secondary | ICD-10-CM | POA: Diagnosis not present

## 2024-04-03 DIAGNOSIS — Z743 Need for continuous supervision: Secondary | ICD-10-CM | POA: Diagnosis not present

## 2024-04-03 DIAGNOSIS — R0602 Shortness of breath: Secondary | ICD-10-CM | POA: Diagnosis not present

## 2024-04-03 DIAGNOSIS — M1711 Unilateral primary osteoarthritis, right knee: Secondary | ICD-10-CM | POA: Diagnosis not present

## 2024-04-03 DIAGNOSIS — R29898 Other symptoms and signs involving the musculoskeletal system: Secondary | ICD-10-CM | POA: Diagnosis not present

## 2024-04-03 DIAGNOSIS — Z5321 Procedure and treatment not carried out due to patient leaving prior to being seen by health care provider: Secondary | ICD-10-CM | POA: Diagnosis not present

## 2024-04-03 DIAGNOSIS — R918 Other nonspecific abnormal finding of lung field: Secondary | ICD-10-CM | POA: Diagnosis not present

## 2024-04-03 DIAGNOSIS — R9431 Abnormal electrocardiogram [ECG] [EKG]: Secondary | ICD-10-CM | POA: Diagnosis not present

## 2024-04-03 DIAGNOSIS — R7989 Other specified abnormal findings of blood chemistry: Secondary | ICD-10-CM | POA: Diagnosis not present

## 2024-04-03 DIAGNOSIS — M25461 Effusion, right knee: Secondary | ICD-10-CM | POA: Diagnosis not present

## 2024-04-03 DIAGNOSIS — I517 Cardiomegaly: Secondary | ICD-10-CM | POA: Diagnosis not present

## 2024-04-08 DIAGNOSIS — G8929 Other chronic pain: Secondary | ICD-10-CM | POA: Diagnosis not present

## 2024-04-08 DIAGNOSIS — S83251D Bucket-handle tear of lateral meniscus, current injury, right knee, subsequent encounter: Secondary | ICD-10-CM | POA: Diagnosis not present

## 2024-04-08 DIAGNOSIS — R29898 Other symptoms and signs involving the musculoskeletal system: Secondary | ICD-10-CM | POA: Diagnosis not present

## 2024-04-08 DIAGNOSIS — Z9181 History of falling: Secondary | ICD-10-CM | POA: Diagnosis not present

## 2024-04-08 DIAGNOSIS — M1711 Unilateral primary osteoarthritis, right knee: Secondary | ICD-10-CM | POA: Diagnosis not present

## 2024-04-08 DIAGNOSIS — R269 Unspecified abnormalities of gait and mobility: Secondary | ICD-10-CM | POA: Diagnosis not present

## 2024-04-08 DIAGNOSIS — M25561 Pain in right knee: Secondary | ICD-10-CM | POA: Diagnosis not present

## 2024-04-10 DIAGNOSIS — K30 Functional dyspepsia: Secondary | ICD-10-CM | POA: Diagnosis not present

## 2024-04-10 DIAGNOSIS — E876 Hypokalemia: Secondary | ICD-10-CM | POA: Diagnosis not present

## 2024-04-15 DIAGNOSIS — L811 Chloasma: Secondary | ICD-10-CM | POA: Diagnosis not present

## 2024-04-15 DIAGNOSIS — L233 Allergic contact dermatitis due to drugs in contact with skin: Secondary | ICD-10-CM | POA: Diagnosis not present

## 2024-04-26 DIAGNOSIS — M25562 Pain in left knee: Secondary | ICD-10-CM | POA: Diagnosis not present

## 2024-05-06 DIAGNOSIS — S83251D Bucket-handle tear of lateral meniscus, current injury, right knee, subsequent encounter: Secondary | ICD-10-CM | POA: Diagnosis not present

## 2024-05-06 DIAGNOSIS — R269 Unspecified abnormalities of gait and mobility: Secondary | ICD-10-CM | POA: Diagnosis not present

## 2024-05-06 DIAGNOSIS — R29898 Other symptoms and signs involving the musculoskeletal system: Secondary | ICD-10-CM | POA: Diagnosis not present

## 2024-05-06 DIAGNOSIS — M25561 Pain in right knee: Secondary | ICD-10-CM | POA: Diagnosis not present

## 2024-05-06 DIAGNOSIS — Z9181 History of falling: Secondary | ICD-10-CM | POA: Diagnosis not present

## 2024-05-06 DIAGNOSIS — M1711 Unilateral primary osteoarthritis, right knee: Secondary | ICD-10-CM | POA: Diagnosis not present

## 2024-05-06 DIAGNOSIS — G8929 Other chronic pain: Secondary | ICD-10-CM | POA: Diagnosis not present

## 2024-05-26 DIAGNOSIS — M25562 Pain in left knee: Secondary | ICD-10-CM | POA: Diagnosis not present

## 2024-06-10 NOTE — Progress Notes (Addendum)
 Tiffany Shepherd                                          MRN: [id number]   07/15/2024   The [facility] Quality Team Specialist reviewed this patient medical record for the purposes of chart review for care gap closure. The following were reviewed: chart review for care gap closure-glycemic status assessment.    [facility] Quality Team

## 2024-06-12 DIAGNOSIS — I10 Essential (primary) hypertension: Secondary | ICD-10-CM | POA: Diagnosis not present

## 2024-06-12 DIAGNOSIS — K5900 Constipation, unspecified: Secondary | ICD-10-CM | POA: Diagnosis not present

## 2024-06-12 DIAGNOSIS — E1169 Type 2 diabetes mellitus with other specified complication: Secondary | ICD-10-CM | POA: Diagnosis not present

## 2024-06-12 DIAGNOSIS — N1831 Chronic kidney disease, stage 3a: Secondary | ICD-10-CM | POA: Diagnosis not present

## 2024-06-12 DIAGNOSIS — R413 Other amnesia: Secondary | ICD-10-CM | POA: Diagnosis not present
